# Patient Record
Sex: Male | Born: 1935 | Race: White | Hispanic: No | Marital: Married | Smoking: Never smoker
Health system: Southern US, Community
[De-identification: ages and names within clinical notes are randomized; demographics above are authoritative.]

## PROBLEM LIST (undated history)

## (undated) DIAGNOSIS — E7800 Pure hypercholesterolemia, unspecified: Secondary | ICD-10-CM

## (undated) DIAGNOSIS — I10 Essential (primary) hypertension: Secondary | ICD-10-CM

---

## 2014-11-09 ENCOUNTER — Ambulatory Visit: Payer: Self-pay | Admitting: Internal Medicine

## 2017-08-07 ENCOUNTER — Emergency Department (HOSPITAL_COMMUNITY): Payer: Medicare Other

## 2017-08-07 ENCOUNTER — Observation Stay (HOSPITAL_COMMUNITY)
Admission: EM | Admit: 2017-08-07 | Discharge: 2017-08-10 | Disposition: A | Discharge status: Home / Self Care | Payer: Medicare Other | Attending: Surgery | Admitting: Surgery

## 2017-08-07 ENCOUNTER — Encounter (HOSPITAL_COMMUNITY): Payer: Self-pay

## 2017-08-07 DIAGNOSIS — K819 Cholecystitis, unspecified: Secondary | ICD-10-CM | POA: Diagnosis not present

## 2017-08-07 DIAGNOSIS — E785 Hyperlipidemia, unspecified: Secondary | ICD-10-CM | POA: Diagnosis not present

## 2017-08-07 DIAGNOSIS — Z79899 Other long term (current) drug therapy: Secondary | ICD-10-CM | POA: Diagnosis not present

## 2017-08-07 DIAGNOSIS — K8012 Calculus of gallbladder with acute and chronic cholecystitis without obstruction: Secondary | ICD-10-CM | POA: Insufficient documentation

## 2017-08-07 DIAGNOSIS — I10 Essential (primary) hypertension: Secondary | ICD-10-CM | POA: Insufficient documentation

## 2017-08-07 DIAGNOSIS — E7800 Pure hypercholesterolemia, unspecified: Secondary | ICD-10-CM | POA: Insufficient documentation

## 2017-08-07 HISTORY — DX: Essential (primary) hypertension: I10

## 2017-08-07 HISTORY — DX: Pure hypercholesterolemia, unspecified: E78.00

## 2017-08-07 LAB — I-STAT TROPONIN, ED: TROPONIN I, POC: 0.01 ng/mL (ref 0.00–0.08)

## 2017-08-07 LAB — URINALYSIS, ROUTINE W REFLEX MICROSCOPIC
BILIRUBIN URINE: NEGATIVE
Glucose, UA: NEGATIVE mg/dL
KETONES UR: NEGATIVE mg/dL
LEUKOCYTES UA: NEGATIVE
Nitrite: NEGATIVE
Protein, ur: 100 mg/dL — AB
SPECIFIC GRAVITY, URINE: 1.014 (ref 1.005–1.030)
SQUAMOUS EPITHELIAL / LPF: NONE SEEN
pH: 6 (ref 5.0–8.0)

## 2017-08-07 LAB — CBC
HEMATOCRIT: 40.6 % (ref 39.0–52.0)
Hemoglobin: 14.3 g/dL (ref 13.0–17.0)
MCH: 31 pg (ref 26.0–34.0)
MCHC: 35.2 g/dL (ref 30.0–36.0)
MCV: 87.9 fL (ref 78.0–100.0)
Platelets: 184 10*3/uL (ref 150–400)
RBC: 4.62 MIL/uL (ref 4.22–5.81)
RDW: 13.6 % (ref 11.5–15.5)
WBC: 11.7 10*3/uL — AB (ref 4.0–10.5)

## 2017-08-07 LAB — COMPREHENSIVE METABOLIC PANEL
ALK PHOS: 54 U/L (ref 38–126)
ALT: 24 U/L (ref 17–63)
ANION GAP: 9 (ref 5–15)
AST: 42 U/L — ABNORMAL HIGH (ref 15–41)
Albumin: 4.1 g/dL (ref 3.5–5.0)
BUN: 20 mg/dL (ref 6–20)
CALCIUM: 10.4 mg/dL — AB (ref 8.9–10.3)
CO2: 27 mmol/L (ref 22–32)
CREATININE: 1.23 mg/dL (ref 0.61–1.24)
Chloride: 99 mmol/L — ABNORMAL LOW (ref 101–111)
GFR calc Af Amer: 59 mL/min — ABNORMAL LOW (ref >60)
GFR, EST NON AFRICAN AMERICAN: 51 mL/min — AB (ref >60)
Glucose, Bld: 139 mg/dL — ABNORMAL HIGH (ref 65–99)
Potassium: 3.6 mmol/L (ref 3.5–5.1)
Sodium: 135 mmol/L (ref 135–145)
Total Bilirubin: 1 mg/dL (ref 0.3–1.2)
Total Protein: 7.1 g/dL (ref 6.5–8.1)

## 2017-08-07 LAB — LIPASE, BLOOD: LIPASE: 25 U/L (ref 11–51)

## 2017-08-07 MED ORDER — IOPAMIDOL (ISOVUE-300) INJECTION 61%
INTRAVENOUS | Status: AC
  Administered 2017-08-07: 100 mL
  Filled 2017-08-07: qty 100

## 2017-08-07 MED ORDER — ONDANSETRON HCL 4 MG/2ML IJ SOLN: 4.0000 mg | Freq: Four times a day (QID) | INTRAMUSCULAR | Status: DC | PRN

## 2017-08-07 MED ORDER — ENOXAPARIN SODIUM 30 MG/0.3ML ~~LOC~~ SOLN
30.0000 mg | Freq: Every day | SUBCUTANEOUS | Status: DC
  Administered 2017-08-09 – 2017-08-10 (×2): 30 mg via SUBCUTANEOUS
  Filled 2017-08-07 (×2): qty 0.3

## 2017-08-07 MED ORDER — ONDANSETRON 4 MG PO TBDP
4.0000 mg | ORAL_TABLET | Freq: Four times a day (QID) | ORAL | Status: DC | PRN
Start: 2017-08-07 — End: 2017-08-10
  Filled 2017-08-07: qty 1

## 2017-08-07 MED ORDER — METOPROLOL TARTRATE 5 MG/5ML IV SOLN: 5.0000 mg | Freq: Four times a day (QID) | INTRAVENOUS | Status: DC | PRN

## 2017-08-07 MED ORDER — ACETAMINOPHEN 325 MG PO TABS
650.0000 mg | ORAL_TABLET | Freq: Four times a day (QID) | ORAL | Status: DC | PRN
  Administered 2017-08-08 – 2017-08-10 (×4): 650 mg via ORAL
  Filled 2017-08-07 (×4): qty 2

## 2017-08-07 MED ORDER — DOCUSATE SODIUM 100 MG PO CAPS
100.0000 mg | ORAL_CAPSULE | Freq: Two times a day (BID) | ORAL | Status: DC
  Administered 2017-08-08 – 2017-08-10 (×4): 100 mg via ORAL
  Filled 2017-08-07 (×4): qty 1

## 2017-08-07 MED ORDER — ACETAMINOPHEN 650 MG RE SUPP: 650.0000 mg | Freq: Four times a day (QID) | RECTAL | Status: DC | PRN

## 2017-08-07 MED ORDER — DIPHENHYDRAMINE HCL 50 MG/ML IJ SOLN: 12.5000 mg | Freq: Four times a day (QID) | INTRAMUSCULAR | Status: DC | PRN

## 2017-08-07 MED ORDER — OXYCODONE HCL 5 MG PO TABS
5.0000 mg | ORAL_TABLET | ORAL | Status: DC | PRN
  Administered 2017-08-09: 10 mg via ORAL
  Administered 2017-08-09: 5 mg via ORAL
  Administered 2017-08-10 (×2): 10 mg via ORAL
  Filled 2017-08-07 (×2): qty 2
  Filled 2017-08-07: qty 1
  Filled 2017-08-07: qty 2

## 2017-08-07 MED ORDER — SODIUM CHLORIDE 0.9 % IV SOLN
INTRAVENOUS | Status: DC
  Administered 2017-08-07: via INTRAVENOUS

## 2017-08-07 MED ORDER — DIPHENHYDRAMINE HCL 12.5 MG/5ML PO ELIX: 12.5000 mg | ORAL_SOLUTION | Freq: Four times a day (QID) | ORAL | Status: DC | PRN

## 2017-08-07 MED ORDER — DEXTROSE 5 % IV SOLN
2.0000 g | Freq: Every day | INTRAVENOUS | Status: DC
  Administered 2017-08-07: 2 g via INTRAVENOUS
  Filled 2017-08-07 (×2): qty 2

## 2017-08-07 MED ORDER — AMLODIPINE BESYLATE 5 MG PO TABS
5.0000 mg | ORAL_TABLET | Freq: Every day | ORAL | Status: DC
  Administered 2017-08-09 – 2017-08-10 (×2): 5 mg via ORAL
  Filled 2017-08-07 (×2): qty 1

## 2017-08-07 MED ORDER — HYDRALAZINE HCL 20 MG/ML IJ SOLN: 10.0000 mg | INTRAMUSCULAR | Status: DC | PRN

## 2017-08-07 MED ORDER — FENTANYL CITRATE (PF) 100 MCG/2ML IJ SOLN: 25.0000 ug | INTRAMUSCULAR | Status: DC | PRN

## 2017-08-07 NOTE — H&P (Signed)
Surgical H&P  CC: abdominal pain  HPI: 83 yo surprisingly healthy gentleman who presents to the [facility] ER today with right upper quadrant pain which is sharp and stabbing in quality and associated with nausea and emesis- spent much of christmas night vomiting. The pain has been constant since it began last night around 7pm. He went to urgent care today where he was given pain medication and antiemetics for suspected biliary colic. This did afford him some relief. He tried to eat crackers while there and this exacerbated the pain, this time radiating into his chest. Denies prior similar symptoms. Denies diarrhea, fever, or urinary symptoms. The pain has never really resolved. He has never had abdominal surgery.  He is retired, worked at [employer] in [employer]. Never used tobacco, alcohol or drugs.  Allergies  Allergen Reactions  . Claritin [Loratadine] Hives    Past Medical History:  Diagnosis Date  . High cholesterol   . Hypertension     History reviewed. No pertinent surgical history.  No family history on file.  Social History   Socioeconomic History  . Marital status: Married    Spouse name: None  . Number of children: None  . Years of education: None  . Highest education level: None  Social Needs  . Financial resource strain: None  . Food insecurity - worry: None  . Food insecurity - inability: None  . Transportation needs - medical: None  . Transportation needs - non-medical: None  Occupational History  . None  Tobacco Use  . Smoking status: Never Smoker  . Smokeless tobacco: Never Used  Substance and Sexual Activity  . Alcohol use: No    Frequency: Never  . Drug use: No  . Sexual activity: None  Other Topics Concern  . None  Social History Narrative  . None    No current facility-administered medications on file prior to encounter.    Current Outpatient Medications on File Prior to Encounter  Medication Sig Dispense Refill  . amLODipine  (NORVASC) 5 MG tablet Take 5 mg by mouth daily.  3  . atorvastatin (LIPITOR) 10 MG tablet Take 10 mg by mouth at bedtime.  3  . HYDROcodone-acetaminophen (NORCO/VICODIN) 5-325 MG tablet Take 1 tablet by mouth See admin instructions. 1 tablet every four to six hours as needed for pain    . meloxicam (MOBIC) 15 MG tablet Take 15 mg by mouth daily as needed (for lower back or hip pain).   1  . promethazine (PHENERGAN) 25 MG tablet Take 25 mg by mouth every 8 (eight) hours as needed for nausea or vomiting.       Review of Systems: a complete, 10pt review of systems was completed with pertinent positives and negatives as documented in the HPI  Physical Exam: Vitals:   08/07/17 1915 08/07/17 1945  BP: (!) 142/73 (!) 125/93  Pulse: 69 83  Resp: 17 16  Temp:    SpO2: 98% 96%   Gen: A&Ox3, no distress  Head: normocephalic, atraumatic, EOMI, anicteric.  Neck: supple without mass or thyromegaly Chest: unlabored respirations, symmetrical air entry   Cardiovascular: RRR with palpable distal pulses, mild bilateral lower extremity pitting edema Abdomen: soft, nondistended, mildly tender in right subcostal margin. No mass or organomegaly.  Extremities: warm, no deformities  Neuro: grossly intact Psych: appropriate mood and affect  Skin: warm and dry   CBC Latest Ref Rng & Units 08/07/2017  WBC 4.0 - 10.5 K/uL 11.7(H)  Hemoglobin 13.0 - 17.0 g/dL 14.3  Hematocrit 39.0 - 52.0 % 40.6  Platelets 150 - 400 K/uL 184    CMP Latest Ref Rng & Units 08/07/2017  Glucose 65 - 99 mg/dL 139(H)  BUN 6 - 20 mg/dL 20  Creatinine 0.61 - 1.24 mg/dL 1.23  Sodium 135 - 145 mmol/L 135  Potassium 3.5 - 5.1 mmol/L 3.6  Chloride 101 - 111 mmol/L 99(L)  CO2 22 - 32 mmol/L 27  Calcium 8.9 - 10.3 mg/dL 10.4(H)  Total Protein 6.5 - 8.1 g/dL 7.1  Total Bilirubin 0.3 - 1.2 mg/dL 1.0  Alkaline Phos 38 - 126 U/L 54  AST 15 - 41 U/L 42(H)  ALT 17 - 63 U/L 24    No results found for: INR, PROTIME  Imaging: Dg  Chest 2 View  Result Date: 08/07/2017 CLINICAL DATA:  Abdominal pain. EXAM: CHEST  2 VIEW COMPARISON:  No recent prior. FINDINGS: Mediastinum and hilar structures are normal. Left base pleuroparenchymal thickening noted consistent with scarring. Mild right base pleuroparenchymal thickening consistent with scarring. Lungs are clear of acute infiltrates. Heart size normal. No acute bony abnormality. IMPRESSION: Bibasilar pleuroparenchymal thickening consistent with scarring. Electronically Signed   By: Marcello Moores  Register   On: 08/07/2017 16:40   Ct Abdomen Pelvis W Contrast  Result Date: 08/07/2017 CLINICAL DATA:  83 year old male with right upper quadrant abdominal pain, nausea and diarrhea. EXAM: CT ABDOMEN AND PELVIS WITH CONTRAST TECHNIQUE: Multidetector CT imaging of the abdomen and pelvis was performed using the standard protocol following bolus administration of intravenous contrast. CONTRAST:  158mL ISOVUE-300 IOPAMIDOL (ISOVUE-300) INJECTION 61% COMPARISON:  None. FINDINGS: Lower chest: Minimal bibasilar atelectatic changes. The visualized lung bases are otherwise clear. No intra-abdominal free air.  Small upper abdominal free fluid. Hepatobiliary: Multiple scattered hypodense lesions within the liver measuring up to 1.3 cm in the right lobe of the liver. The larger lesions demonstrate fluid attenuation most consistent with cysts and the smaller lesions are too small to characterize. There is no intrahepatic biliary ductal dilatation. The gallbladder is distended. There multiple stones within the gallbladder. There is inflammatory changes and fluid surrounding the gallbladder most consistent with acute cholecystitis. Pancreas: Small amount of fluid and inflammatory changes surrounding the head of the pancreas likely extension from the inflammatory changes of the gallbladder. Correlation with clinical exam and pancreatic enzymes recommended to exclude associated pancreatitis. Spleen: Normal in size  without focal abnormality. Adrenals/Urinary Tract: Mild renal parenchyma atrophy. Bilateral renal hypodense lesions measuring up to 3.6 cm in the upper pole of the left kidney most consistent with cysts. There is no hydronephrosis on either side. The visualized ureters appear unremarkable. There is apparent diffuse thickening of the bladder wall which may be partly related to underdistention. Cystitis is not excluded. Correlation with urinalysis recommended. Stomach/Bowel: There is sigmoid diverticulosis and scattered colonic diverticula without active inflammatory changes. There is no bowel obstruction or active inflammation. Normal appendix. Vascular/Lymphatic: There is advanced aortoiliac atherosclerotic disease. The origins of the celiac axis, SMA, IMA appear patent. The SMV, splenic vein, and main portal vein are patent. No portal venous gas. There is no adenopathy. Reproductive: Mildly enlarged prostate gland measuring 4.8 cm in diameter. Other: None Musculoskeletal: Multilevel degenerative changes of the spine with disc desiccation and vacuum phenomena. No acute osseous pathology. IMPRESSION: 1. Cholelithiasis with findings most consistent with acute cholecystitis. Clinical correlation is recommended. 2. Mild inflammatory fluid adjacent to the head of the pancreas, likely extension from the gallbladder. Correlation with clinical exam and pancreatic enzymes recommended to exclude associated  pancreatitis. 3. Extensive colonic diverticulosis. No bowel obstruction or active inflammation. Normal appendix. 4.  Aortic Atherosclerosis (ICD10-I70.0). Electronically Signed   By: Anner Crete M.D.   On: 08/07/2017 20:05    A/P: 83yo man with clinical picture and CT concerning for cholecystitis -Admit for pain and nausea control, fluid resuscitation, empiric IV antibiotics -Repeat labs in AM -Plan laparoscopic cholecystectomy tomorrow or Friday with Dr. Dema Severin pending OR availability  I discussed laparoscopic  cholecystectomy with the patient and his son. Discussed risks of pain, scarring, bleeding, intraabdominal injury specifically to the common bile duct and sequelae, bile leak, conversion to open surgery, hernia, ileus, heart attack, pneumonia, blood clot, stroke or even death. Discussed risks of no surgery including worsening cholecystitis, cholangitis, or pancreatitis. Questions welcomed and answered.   Romana Juniper, MD [facility], [place] Pager [phone]

## 2017-08-07 NOTE — ED Triage Notes (Signed)
Per Pt, PT is coming from home where he reports having upper right abdominal pain that started yesterday. Pt was seen at [facility] in [place] and was sent home with medication and possible gallbladder attack. Pt woke up about an hour ago with pain returning. Reports some nausea and vomiting through the night.

## 2017-08-07 NOTE — ED Notes (Signed)
Family remains at bedside.
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
## 2017-08-07 NOTE — ED Notes (Signed)
Pt returned from CT °

## 2017-08-07 NOTE — ED Notes (Signed)
Patient transported to CT 

## 2017-08-07 NOTE — ED Notes (Signed)
ED PA at bedside

## 2017-08-07 NOTE — ED Provider Notes (Signed)
[facility] EMERGENCY DEPARTMENT Provider Note   CSN: [id number] Arrival date & time: 08/07/17  1540     History   Chief Complaint Chief Complaint  Patient presents with  . Abdominal Pain  . Chest Pain    HPI Xavier Foster is a 83 y.o. male.  HPI   83 year old male presents today with complaints of right upper quadrant abdominal pain.  Patient reports he was in his usual state of health last night when he developed sharp stabbing right upper quadrant abdominal pain.  Patient notes associated nausea and small amount of vomiting.  He is persistent since onset of symptoms, was seen by urgent care today and diagnosed with biliary colic.  Patient notes he was given pain medication which slightly improved symptoms, pain continued to persist.  After eating crackers at urgent care he developed epigastric pain radiating into his chest.  He notes this lasted approximately 15 minutes..  Patient denies any associated shortness of breath, reports minor right lower abdominal pain.  He denies any dysuria, diarrhea, fever.  Patient denies any history of the same.   Past Medical History:  Diagnosis Date  . High cholesterol   . Hypertension     Patient Active Problem List   Diagnosis Date Noted  . Cholecystitis 08/07/2017    History reviewed. No pertinent surgical history.     Home Medications    Prior to Admission medications   Medication Sig Start Date End Date Taking? Authorizing Provider  amLODipine (NORVASC) 5 MG tablet Take 5 mg by mouth daily. 06/14/17  Yes [provider]  atorvastatin (LIPITOR) 10 MG tablet Take 10 mg by mouth at bedtime. 06/28/17  Yes [provider]  HYDROcodone-acetaminophen (NORCO/VICODIN) 5-325 MG tablet Take 1 tablet by mouth See admin instructions. 1 tablet every four to six hours as needed for pain 08/07/17  Yes [provider]  meloxicam (MOBIC) 15 MG tablet Take 15 mg by mouth daily as needed (for  lower back or hip pain).  06/12/17  Yes [provider]  promethazine (PHENERGAN) 25 MG tablet Take 25 mg by mouth every 8 (eight) hours as needed for nausea or vomiting.  08/07/17  Yes [provider]    Family History No family history on file.  Social History Social History   Tobacco Use  . Smoking status: Never Smoker  . Smokeless tobacco: Never Used  Substance Use Topics  . Alcohol use: No    Frequency: Never  . Drug use: No     Allergies   Claritin [loratadine]   Review of Systems Review of Systems  All other systems reviewed and are negative.   Physical Exam Updated Vital Signs BP (!) 125/93   Pulse 83   Temp 98.5 F (36.9 C) (Oral)   Resp 16   Ht 5\' 7"  (1.702 m)   Wt 67.1 kg (148 lb)   SpO2 96%   BMI 23.18 kg/m   Physical Exam  Constitutional: He is oriented to person, place, and time. He appears well-developed and well-nourished.  HENT:  Head: Normocephalic and atraumatic.  Eyes: Conjunctivae are normal. Pupils are equal, round, and reactive to light. Right eye exhibits no discharge. Left eye exhibits no discharge. No scleral icterus.  Neck: Normal range of motion. No JVD present. No tracheal deviation present.  Pulmonary/Chest: Effort normal. No stridor.  Abdominal:  Tenderness palpation her right upper and right lower abdomen-no rebound or guarding, no masses  Neurological: He is alert and oriented to  person, place, and time. Coordination normal.  Psychiatric: He has a normal mood and affect. His behavior is normal. Judgment and thought content normal.  Nursing note and vitals reviewed.    ED Treatments / Results  Labs (all labs ordered are listed, but only abnormal results are displayed) Labs Reviewed  COMPREHENSIVE METABOLIC PANEL - Abnormal; Notable for the following components:      Result Value   Chloride 99 (*)    Glucose, Bld 139 (*)    Calcium 10.4 (*)    AST 42 (*)    GFR calc non Af Amer 51 (*)    GFR calc Af  Amer 59 (*)    All other components within normal limits  CBC - Abnormal; Notable for the following components:   WBC 11.7 (*)    All other components within normal limits  URINALYSIS, ROUTINE W REFLEX MICROSCOPIC - Abnormal; Notable for the following components:   Hgb urine dipstick SMALL (*)    Protein, ur 100 (*)    Bacteria, UA RARE (*)    All other components within normal limits  LIPASE, BLOOD  COMPREHENSIVE METABOLIC PANEL  CBC  I-STAT TROPONIN, ED    EKG  EKG Interpretation None       Radiology Dg Chest 2 View  Result Date: 08/07/2017 CLINICAL DATA:  Abdominal pain. EXAM: CHEST  2 VIEW COMPARISON:  No recent prior. FINDINGS: Mediastinum and hilar structures are normal. Left base pleuroparenchymal thickening noted consistent with scarring. Mild right base pleuroparenchymal thickening consistent with scarring. Lungs are clear of acute infiltrates. Heart size normal. No acute bony abnormality. IMPRESSION: Bibasilar pleuroparenchymal thickening consistent with scarring. Electronically Signed   By: Marcello Moores  Register   On: 08/07/2017 16:40   Ct Abdomen Pelvis W Contrast  Result Date: 08/07/2017 CLINICAL DATA:  83 year old male with right upper quadrant abdominal pain, nausea and diarrhea. EXAM: CT ABDOMEN AND PELVIS WITH CONTRAST TECHNIQUE: Multidetector CT imaging of the abdomen and pelvis was performed using the standard protocol following bolus administration of intravenous contrast. CONTRAST:  156mL ISOVUE-300 IOPAMIDOL (ISOVUE-300) INJECTION 61% COMPARISON:  None. FINDINGS: Lower chest: Minimal bibasilar atelectatic changes. The visualized lung bases are otherwise clear. No intra-abdominal free air.  Small upper abdominal free fluid. Hepatobiliary: Multiple scattered hypodense lesions within the liver measuring up to 1.3 cm in the right lobe of the liver. The larger lesions demonstrate fluid attenuation most consistent with cysts and the smaller lesions are too small to  characterize. There is no intrahepatic biliary ductal dilatation. The gallbladder is distended. There multiple stones within the gallbladder. There is inflammatory changes and fluid surrounding the gallbladder most consistent with acute cholecystitis. Pancreas: Small amount of fluid and inflammatory changes surrounding the head of the pancreas likely extension from the inflammatory changes of the gallbladder. Correlation with clinical exam and pancreatic enzymes recommended to exclude associated pancreatitis. Spleen: Normal in size without focal abnormality. Adrenals/Urinary Tract: Mild renal parenchyma atrophy. Bilateral renal hypodense lesions measuring up to 3.6 cm in the upper pole of the left kidney most consistent with cysts. There is no hydronephrosis on either side. The visualized ureters appear unremarkable. There is apparent diffuse thickening of the bladder wall which may be partly related to underdistention. Cystitis is not excluded. Correlation with urinalysis recommended. Stomach/Bowel: There is sigmoid diverticulosis and scattered colonic diverticula without active inflammatory changes. There is no bowel obstruction or active inflammation. Normal appendix. Vascular/Lymphatic: There is advanced aortoiliac atherosclerotic disease. The origins of the celiac axis, SMA, IMA appear patent.  The SMV, splenic vein, and main portal vein are patent. No portal venous gas. There is no adenopathy. Reproductive: Mildly enlarged prostate gland measuring 4.8 cm in diameter. Other: None Musculoskeletal: Multilevel degenerative changes of the spine with disc desiccation and vacuum phenomena. No acute osseous pathology. IMPRESSION: 1. Cholelithiasis with findings most consistent with acute cholecystitis. Clinical correlation is recommended. 2. Mild inflammatory fluid adjacent to the head of the pancreas, likely extension from the gallbladder. Correlation with clinical exam and pancreatic enzymes recommended to exclude  associated pancreatitis. 3. Extensive colonic diverticulosis. No bowel obstruction or active inflammation. Normal appendix. 4.  Aortic Atherosclerosis (ICD10-I70.0). Electronically Signed   By: Anner Crete M.D.   On: 08/07/2017 20:05    Procedures Procedures (including critical care time)  Medications Ordered in ED Medications  enoxaparin (LOVENOX) injection 30 mg (not administered)  0.9 %  sodium chloride infusion (not administered)  cefTRIAXone (ROCEPHIN) 2 g in dextrose 5 % 50 mL IVPB (not administered)  acetaminophen (TYLENOL) tablet 650 mg (not administered)    Or  acetaminophen (TYLENOL) suppository 650 mg (not administered)  oxyCODONE (Oxy IR/ROXICODONE) immediate release tablet 5-10 mg (not administered)  fentaNYL (SUBLIMAZE) injection 25 mcg (not administered)  diphenhydrAMINE (BENADRYL) 12.5 MG/5ML elixir 12.5 mg (not administered)    Or  diphenhydrAMINE (BENADRYL) injection 12.5 mg (not administered)  docusate sodium (COLACE) capsule 100 mg (not administered)  ondansetron (ZOFRAN-ODT) disintegrating tablet 4 mg (not administered)    Or  ondansetron (ZOFRAN) injection 4 mg (not administered)  metoprolol tartrate (LOPRESSOR) injection 5 mg (not administered)  hydrALAZINE (APRESOLINE) injection 10 mg (not administered)  amLODipine (NORVASC) tablet 5 mg (not administered)  iopamidol (ISOVUE-300) 61 % injection (100 mLs  Contrast Given 08/07/17 1935)     Initial Impression / Assessment and Plan / ED Course  I have reviewed the triage vital signs and the nursing notes.  Pertinent labs & imaging results that were available during my care of the patient were reviewed by me and considered in my medical decision making (see chart for details).      Final Clinical Impressions(s) / ED Diagnoses   Final diagnoses:  Cholecystitis    Labs: I-STAT troponin, lipase, CMP, CBC, urinalysis  Imaging:  Consults: General surgery   Therapeutics:  Discharge Meds:    Assessment/Plan:  83 year old male presents today with likely cholecystitis.  Patient is well-appearing in no apparent distress.  General surgery consulted to evaluate patient here in the ED.    ED Discharge Orders    None       Francee Gentile 08/07/17 2059    Jola Schmidt, MD 08/07/17 2140

## 2017-08-08 ENCOUNTER — Encounter (HOSPITAL_COMMUNITY)
Admission: EM | Disposition: A | Discharge status: Home / Self Care | Payer: Self-pay | Source: Home / Self Care | Attending: Emergency Medicine

## 2017-08-08 ENCOUNTER — Observation Stay (HOSPITAL_COMMUNITY): Payer: Medicare Other | Admitting: Certified Registered"

## 2017-08-08 ENCOUNTER — Encounter (HOSPITAL_COMMUNITY): Payer: Self-pay | Admitting: *Deleted

## 2017-08-08 DIAGNOSIS — E785 Hyperlipidemia, unspecified: Secondary | ICD-10-CM | POA: Diagnosis not present

## 2017-08-08 DIAGNOSIS — I10 Essential (primary) hypertension: Secondary | ICD-10-CM | POA: Diagnosis not present

## 2017-08-08 DIAGNOSIS — K8012 Calculus of gallbladder with acute and chronic cholecystitis without obstruction: Secondary | ICD-10-CM | POA: Diagnosis not present

## 2017-08-08 DIAGNOSIS — K819 Cholecystitis, unspecified: Secondary | ICD-10-CM | POA: Diagnosis not present

## 2017-08-08 HISTORY — PX: CHOLECYSTECTOMY: SHX55

## 2017-08-08 LAB — COMPREHENSIVE METABOLIC PANEL
ALBUMIN: 3.5 g/dL (ref 3.5–5.0)
ALK PHOS: 46 U/L (ref 38–126)
ALT: 21 U/L (ref 17–63)
AST: 31 U/L (ref 15–41)
Anion gap: 13 (ref 5–15)
BILIRUBIN TOTAL: 0.9 mg/dL (ref 0.3–1.2)
BUN: 20 mg/dL (ref 6–20)
CALCIUM: 9.2 mg/dL (ref 8.9–10.3)
CO2: 23 mmol/L (ref 22–32)
Chloride: 99 mmol/L — ABNORMAL LOW (ref 101–111)
Creatinine, Ser: 1.27 mg/dL — ABNORMAL HIGH (ref 0.61–1.24)
GFR calc Af Amer: 57 mL/min — ABNORMAL LOW (ref >60)
GFR calc non Af Amer: 49 mL/min — ABNORMAL LOW (ref >60)
GLUCOSE: 105 mg/dL — AB (ref 65–99)
Potassium: 3.2 mmol/L — ABNORMAL LOW (ref 3.5–5.1)
Sodium: 135 mmol/L (ref 135–145)
TOTAL PROTEIN: 6.3 g/dL — AB (ref 6.5–8.1)

## 2017-08-08 LAB — CBC
HCT: 39.6 % (ref 39.0–52.0)
HEMOGLOBIN: 13.6 g/dL (ref 13.0–17.0)
MCH: 30.4 pg (ref 26.0–34.0)
MCHC: 34.3 g/dL (ref 30.0–36.0)
MCV: 88.4 fL (ref 78.0–100.0)
PLATELETS: 184 10*3/uL (ref 150–400)
RBC: 4.48 MIL/uL (ref 4.22–5.81)
RDW: 14 % (ref 11.5–15.5)
WBC: 9 10*3/uL (ref 4.0–10.5)

## 2017-08-08 LAB — SURGICAL PCR SCREEN
MRSA, PCR: POSITIVE — AB
Staphylococcus aureus: POSITIVE — AB

## 2017-08-08 SURGERY — LAPAROSCOPIC CHOLECYSTECTOMY
Anesthesia: General | Site: Abdomen

## 2017-08-08 MED ORDER — HYDROMORPHONE HCL 1 MG/ML IJ SOLN: 0.5000 mg | INTRAMUSCULAR | Status: DC | PRN

## 2017-08-08 MED ORDER — PROPOFOL 10 MG/ML IV BOLUS
INTRAVENOUS | Status: DC | PRN
  Administered 2017-08-08: 90 mg via INTRAVENOUS

## 2017-08-08 MED ORDER — LIDOCAINE 2% (20 MG/ML) 5 ML SYRINGE
INTRAMUSCULAR | Status: DC | PRN
  Administered 2017-08-08: 100 mg via INTRAVENOUS

## 2017-08-08 MED ORDER — NEOSTIGMINE METHYLSULFATE 5 MG/5ML IV SOSY
PREFILLED_SYRINGE | INTRAVENOUS | Status: AC
  Filled 2017-08-08: qty 5

## 2017-08-08 MED ORDER — PHENYLEPHRINE 40 MCG/ML (10ML) SYRINGE FOR IV PUSH (FOR BLOOD PRESSURE SUPPORT)
PREFILLED_SYRINGE | INTRAVENOUS | Status: AC
  Filled 2017-08-08: qty 10

## 2017-08-08 MED ORDER — ONDANSETRON HCL 4 MG/2ML IJ SOLN
INTRAMUSCULAR | Status: AC
  Filled 2017-08-08: qty 2

## 2017-08-08 MED ORDER — SUGAMMADEX SODIUM 500 MG/5ML IV SOLN
INTRAVENOUS | Status: AC
  Filled 2017-08-08: qty 5

## 2017-08-08 MED ORDER — MEPERIDINE HCL 25 MG/ML IJ SOLN: 6.2500 mg | INTRAMUSCULAR | Status: DC | PRN

## 2017-08-08 MED ORDER — ONDANSETRON HCL 4 MG/2ML IJ SOLN
INTRAMUSCULAR | Status: DC | PRN
Start: 2017-08-08 — End: 2017-08-08
  Administered 2017-08-08: 4 mg via INTRAVENOUS

## 2017-08-08 MED ORDER — BUPIVACAINE-EPINEPHRINE 0.25% -1:200000 IJ SOLN
INTRAMUSCULAR | Status: DC | PRN
  Administered 2017-08-08: 26 mL

## 2017-08-08 MED ORDER — MUPIROCIN 2 % EX OINT
1.0000 "application " | TOPICAL_OINTMENT | Freq: Two times a day (BID) | CUTANEOUS | Status: DC
  Administered 2017-08-08 – 2017-08-09 (×4): 1 via NASAL
  Filled 2017-08-08 (×2): qty 22

## 2017-08-08 MED ORDER — CHLORHEXIDINE GLUCONATE CLOTH 2 % EX PADS
6.0000 | MEDICATED_PAD | Freq: Every day | CUTANEOUS | Status: DC
  Administered 2017-08-08 – 2017-08-10 (×3): 6 via TOPICAL

## 2017-08-08 MED ORDER — BUPIVACAINE-EPINEPHRINE (PF) 0.25% -1:200000 IJ SOLN
INTRAMUSCULAR | Status: AC
  Filled 2017-08-08: qty 30

## 2017-08-08 MED ORDER — HYDROMORPHONE HCL 1 MG/ML IJ SOLN
INTRAMUSCULAR | Status: AC
  Filled 2017-08-08: qty 1

## 2017-08-08 MED ORDER — TRAMADOL HCL 50 MG PO TABS
50.0000 mg | ORAL_TABLET | Freq: Four times a day (QID) | ORAL | Status: DC | PRN
Start: 2017-08-08 — End: 2017-08-10

## 2017-08-08 MED ORDER — FENTANYL CITRATE (PF) 250 MCG/5ML IJ SOLN
INTRAMUSCULAR | Status: AC
  Filled 2017-08-08: qty 5

## 2017-08-08 MED ORDER — LIDOCAINE 2% (20 MG/ML) 5 ML SYRINGE
INTRAMUSCULAR | Status: AC
  Filled 2017-08-08: qty 5

## 2017-08-08 MED ORDER — FENTANYL CITRATE (PF) 100 MCG/2ML IJ SOLN
INTRAMUSCULAR | Status: DC | PRN
Start: 2017-08-08 — End: 2017-08-08
  Administered 2017-08-08: 100 ug via INTRAVENOUS
  Administered 2017-08-08 (×3): 50 ug via INTRAVENOUS

## 2017-08-08 MED ORDER — HEMOSTATIC AGENTS (NO CHARGE) OPTIME
TOPICAL | Status: DC | PRN
  Administered 2017-08-08: 1 via TOPICAL

## 2017-08-08 MED ORDER — 0.9 % SODIUM CHLORIDE (POUR BTL) OPTIME
TOPICAL | Status: DC | PRN
  Administered 2017-08-08: 1000 mL

## 2017-08-08 MED ORDER — PHENYLEPHRINE HCL 10 MG/ML IJ SOLN
INTRAMUSCULAR | Status: AC
  Filled 2017-08-08: qty 1

## 2017-08-08 MED ORDER — ROCURONIUM BROMIDE 10 MG/ML (PF) SYRINGE
PREFILLED_SYRINGE | INTRAVENOUS | Status: AC
  Filled 2017-08-08: qty 5

## 2017-08-08 MED ORDER — LACTATED RINGERS IV SOLN
INTRAVENOUS | Status: DC
  Administered 2017-08-08 (×2): via INTRAVENOUS

## 2017-08-08 MED ORDER — ROCURONIUM BROMIDE 10 MG/ML (PF) SYRINGE
PREFILLED_SYRINGE | INTRAVENOUS | Status: DC | PRN
  Administered 2017-08-08: 50 mg via INTRAVENOUS
  Administered 2017-08-08: 10 mg via INTRAVENOUS

## 2017-08-08 MED ORDER — ONDANSETRON HCL 4 MG/2ML IJ SOLN: 4.0000 mg | Freq: Once | INTRAMUSCULAR | Status: DC | PRN

## 2017-08-08 MED ORDER — PHENYLEPHRINE HCL 10 MG/ML IJ SOLN
INTRAMUSCULAR | Status: DC | PRN
  Administered 2017-08-08: 25 ug/min via INTRAVENOUS

## 2017-08-08 MED ORDER — SCOPOLAMINE 1 MG/3DAYS TD PT72
MEDICATED_PATCH | TRANSDERMAL | Status: DC | PRN
  Administered 2017-08-08: 1 via TRANSDERMAL

## 2017-08-08 MED ORDER — PROPOFOL 10 MG/ML IV BOLUS
INTRAVENOUS | Status: AC
  Filled 2017-08-08: qty 20

## 2017-08-08 MED ORDER — HYDROMORPHONE HCL 1 MG/ML IJ SOLN
0.2500 mg | INTRAMUSCULAR | Status: DC | PRN
  Administered 2017-08-08 (×2): 0.5 mg via INTRAVENOUS

## 2017-08-08 MED ORDER — SODIUM CHLORIDE 0.9 % IR SOLN
Status: DC | PRN
  Administered 2017-08-08: 1

## 2017-08-08 MED ORDER — SUGAMMADEX SODIUM 200 MG/2ML IV SOLN
INTRAVENOUS | Status: DC | PRN
Start: 2017-08-08 — End: 2017-08-08
  Administered 2017-08-08: 240 mg via INTRAVENOUS

## 2017-08-08 MED ORDER — PHENYLEPHRINE 40 MCG/ML (10ML) SYRINGE FOR IV PUSH (FOR BLOOD PRESSURE SUPPORT)
PREFILLED_SYRINGE | INTRAVENOUS | Status: DC | PRN
  Administered 2017-08-08 (×2): 120 ug via INTRAVENOUS

## 2017-08-08 SURGICAL SUPPLY — 45 items
APPLIER CLIP 5 13 M/L LIGAMAX5 (MISCELLANEOUS) ×3
BLADE CLIPPER SURG (BLADE) IMPLANT
CANISTER SUCT 3000ML PPV (MISCELLANEOUS) ×3 IMPLANT
CHLORAPREP W/TINT 26ML (MISCELLANEOUS) ×3 IMPLANT
CLIP APPLIE 5 13 M/L LIGAMAX5 (MISCELLANEOUS) ×1 IMPLANT
COVER SURGICAL LIGHT HANDLE (MISCELLANEOUS) ×3 IMPLANT
CUTTER FLEX LINEAR 45M (STAPLE) ×3 IMPLANT
DERMABOND ADHESIVE PROPEN (GAUZE/BANDAGES/DRESSINGS) ×2
DERMABOND ADVANCED (GAUZE/BANDAGES/DRESSINGS) ×2
DERMABOND ADVANCED .7 DNX12 (GAUZE/BANDAGES/DRESSINGS) ×1 IMPLANT
DERMABOND ADVANCED .7 DNX6 (GAUZE/BANDAGES/DRESSINGS) ×1 IMPLANT
DISSECTOR BLUNT TIP ENDO 5MM (MISCELLANEOUS) ×3 IMPLANT
ELECT REM PT RETURN 9FT ADLT (ELECTROSURGICAL) ×3
ELECTRODE REM PT RTRN 9FT ADLT (ELECTROSURGICAL) ×1 IMPLANT
GLOVE BIOGEL M STRL SZ7.5 (GLOVE) ×3 IMPLANT
GLOVE BIOGEL PI IND STRL 7.0 (GLOVE) ×1 IMPLANT
GLOVE BIOGEL PI IND STRL 8 (GLOVE) ×1 IMPLANT
GLOVE BIOGEL PI INDICATOR 7.0 (GLOVE) ×2
GLOVE BIOGEL PI INDICATOR 8 (GLOVE) ×2
GLOVE SURG SS PI 7.0 STRL IVOR (GLOVE) ×3 IMPLANT
GOWN STRL REUS W/ TWL LRG LVL3 (GOWN DISPOSABLE) ×2 IMPLANT
GOWN STRL REUS W/ TWL XL LVL3 (GOWN DISPOSABLE) ×1 IMPLANT
GOWN STRL REUS W/TWL LRG LVL3 (GOWN DISPOSABLE) ×4
GOWN STRL REUS W/TWL XL LVL3 (GOWN DISPOSABLE) ×2
HEMOSTAT SNOW SURGICEL 2X4 (HEMOSTASIS) ×3 IMPLANT
KIT BASIN OR (CUSTOM PROCEDURE TRAY) ×3 IMPLANT
KIT ROOM TURNOVER OR (KITS) ×3 IMPLANT
NS IRRIG 1000ML POUR BTL (IV SOLUTION) ×3 IMPLANT
PAD ARMBOARD 7.5X6 YLW CONV (MISCELLANEOUS) ×6 IMPLANT
POUCH SPECIMEN RETRIEVAL 10MM (ENDOMECHANICALS) ×3 IMPLANT
RELOAD STAPLE TA45 3.5 REG BLU (ENDOMECHANICALS) ×3 IMPLANT
SCISSORS LAP 5X35 DISP (ENDOMECHANICALS) ×3 IMPLANT
SET IRRIG TUBING LAPAROSCOPIC (IRRIGATION / IRRIGATOR) ×3 IMPLANT
SLEEVE ENDOPATH XCEL 5M (ENDOMECHANICALS) ×6 IMPLANT
SPECIMEN JAR SMALL (MISCELLANEOUS) ×3 IMPLANT
SUT MNCRL AB 4-0 PS2 18 (SUTURE) ×6 IMPLANT
SUT VICRYL 0 UR6 27IN ABS (SUTURE) ×3 IMPLANT
TOWEL OR 17X24 6PK STRL BLUE (TOWEL DISPOSABLE) ×3 IMPLANT
TOWEL OR 17X26 10 PK STRL BLUE (TOWEL DISPOSABLE) ×3 IMPLANT
TRAY LAPAROSCOPIC MC (CUSTOM PROCEDURE TRAY) ×3 IMPLANT
TROCAR BLADELESS 11MM (ENDOMECHANICALS) IMPLANT
TROCAR BLADELESS 12MM (ENDOMECHANICALS) ×3 IMPLANT
TROCAR XCEL BLUNT TIP 100MML (ENDOMECHANICALS) ×3 IMPLANT
TROCAR XCEL NON-BLD 5MMX100MML (ENDOMECHANICALS) ×3 IMPLANT
TUBING INSUFFLATION (TUBING) ×3 IMPLANT

## 2017-08-08 NOTE — Progress Notes (Signed)
Subjective No acute events. Pain better since IV abx staretd. Denies complaints. No n/v.  Objective: Vital signs in last 24 hours: Temp:  [97.8 F (36.6 C)-98.9 F (37.2 C)] 98.9 F (37.2 C) (12/27 0500) Pulse Rate:  [69-84] 78 (12/27 0500) Resp:  [16-20] 18 (12/27 0500) BP: (125-167)/(67-93) 153/67 (12/27 0500) SpO2:  [96 %-99 %] 97 % (12/27 0500) Weight:  [60.4 kg (133 lb 2.5 oz)-67.1 kg (148 lb)] 60.4 kg (133 lb 2.5 oz) (12/27 0129) Last BM Date: 08/07/17  Intake/Output from previous day: 12/26 0701 - 12/27 0700 In: -  Out: 2 [Urine:1; Stool:1] Intake/Output this shift: No intake/output data recorded.  Gen: NAD, comfortable CV: RRR Pulm: Normal work of breathing Abd: Soft, minimally ttp in RUQ; no r/g; nondistended abdomen Ext: SCDs in place  Lab Results: CBC  Recent Labs    08/07/17 1616  WBC 11.7*  HGB 14.3  HCT 40.6  PLT 184   BMET Recent Labs    08/07/17 1616  NA 135  K 3.6  CL 99*  CO2 27  GLUCOSE 139*  BUN 20  CREATININE 1.23  CALCIUM 10.4*    Anti-infectives: Anti-infectives (From admission, onward)   Start     Dose/Rate Route Frequency Ordered Stop   08/07/17 2300  cefTRIAXone (ROCEPHIN) 2 g in dextrose 5 % 50 mL IVPB     2 g 100 mL/hr over 30 Minutes Intravenous Daily at bedtime 08/07/17 2044         Assessment/Plan: Patient Active Problem List   Diagnosis Date Noted  . Cholecystitis 08/07/2017   83yo gentleman with hx HTN, [name] admitted with acute cholecystitis - 2d of RUQ pain, WBC 11.7, CT showing stones and some surrounding inflammation around gallbladder  -IVF, NPO, IV abx -Possible OR later today for laparoscopic vs open cholecystectomy, but more likely than not will not be able to do until tomorrow due to scheduling issues - ok for clears at 4pm if not going to OR; NPO after midnight -PPx: SQH, SCDs  Sharon Mt. Dema Severin, M.D. [facility]

## 2017-08-08 NOTE — Anesthesia Postprocedure Evaluation (Signed)
Anesthesia Post Note  Patient: Xavier Foster  Procedure(s) Performed: LAPAROSCOPIC CHOLECYSTECTOMY (N/A Abdomen)     Patient location during evaluation: PACU Anesthesia Type: General Level of consciousness: awake and alert Pain management: pain level controlled Vital Signs Assessment: post-procedure vital signs reviewed and stable Respiratory status: spontaneous breathing, nonlabored ventilation, respiratory function stable and patient connected to nasal cannula oxygen Cardiovascular status: blood pressure returned to baseline and stable Postop Assessment: no apparent nausea or vomiting Anesthetic complications: no    Last Vitals:  Vitals:   08/08/17 1752 08/08/17 1827  BP:  (!) 170/80  Pulse: 95 95  Resp: 20 20  Temp: (!) 36.3 C 36.6 C  SpO2: 96% 98%    Last Pain:  Vitals:   08/08/17 1827  TempSrc: Oral  PainSc:                  Ell Tiso DAVID

## 2017-08-08 NOTE — Progress Notes (Signed)
Patient ambulated to the bathroom with one assist and walker and voided.  He tolerated the walk very well.

## 2017-08-08 NOTE — Progress Notes (Signed)
The anatomy & physiology of hepatobiliary & pancreatic function was discussed.  The pathophysiology of gallbladder dysfunction was discussed.  Natural history risks without surgery was discussed.   I feel the risks of no intervention will lead to serious problems that outweigh the operative risks; therefore, I recommended cholecystectomy to address the pathology.  I explained laparoscopic techniques with possible need for an open approach.  Possible cholangiogram to evaluate the bilary tract was explained as well.    The planned procedure, material risks (including but not limited to pain, bleeding, infection, abscess, bile leak, injury to other organs/viscus/vessels/nerves, need for additional procedures, heart attack, stroke, death) benefits and alternatives were discussed.  I noted a good likelihood this will help address the problem.  Possibility that this will not correct all abdominal symptoms was explained.  Goals of post-operative recovery were discussed as well. His questions were answered to his satisfaction and he elected to proceed with surgery  Sharon Mt. Dema Severin, M.D. [facility]

## 2017-08-08 NOTE — Anesthesia Procedure Notes (Signed)
Procedure Name: Intubation Date/Time: 08/08/2017 3:05 PM Performed by: Barrington Ellison, CRNA Pre-anesthesia Checklist: Patient identified, Emergency Drugs available, Suction available and Patient being monitored Patient Re-evaluated:Patient Re-evaluated prior to induction Oxygen Delivery Method: Circle System Utilized Preoxygenation: Pre-oxygenation with 100% oxygen Induction Type: IV induction Ventilation: Mask ventilation without difficulty Laryngoscope Size: Mac and 3 Grade View: Grade I Tube type: Oral Tube size: 7.5 mm Number of attempts: 1 Airway Equipment and Method: Stylet and Oral airway Placement Confirmation: ETT inserted through vocal cords under direct vision,  positive ETCO2 and breath sounds checked- equal and bilateral Secured at: 22 cm Tube secured with: Tape Dental Injury: Teeth and Oropharynx as per pre-operative assessment

## 2017-08-08 NOTE — Progress Notes (Signed)
Report given to short stay at this time.  

## 2017-08-08 NOTE — Op Note (Addendum)
08/08/2017 4:34 PM  PATIENT: Xavier Foster  83 y.o. male  Patient Care Team: Albina Billet, MD as PCP - General (Internal Medicine)  PRE-OPERATIVE DIAGNOSIS: CHOLECYSTITIS  POST-OPERATIVE DIAGNOSIS: CHOLECYSTITIS  PROCEDURE: Laparoscopic cholecystectomy  SURGEON: Sharon Mt. Theda Payer, MD  ASSISTANT: none   ANESTHESIA: General endotracheal  EBL: No intake/output data recorded.  DRAINS: None  SPECIMEN: Gallbladder  COUNTS: Sponge, needle and instrument counts were reported correct x2 at the conclusion of the operation  DISPOSITION: PACU in satisfactory condition  FINDINGS: Inflamed gallbladder; critical view of safety obtained prior to ligation/division of any structures.  INDICATION:  Xavier Foster is a very pleasant 83yo gentleman with hx of HTN, [name] admitted overnight with acute cholecystitis.   The anatomy & physiology of hepatobiliary & pancreatic function was discussed. The pathophysiology of gallbladder dysfunction was discussed. Natural history and material risks without surgery was discussed. I feel the risks of no intervention will lead to serious problems that outweigh the operative risks; therefore, I recommended cholecystectomy to remove the probable pathology. I explained laparoscopic techniques with possible need for an open approach.  The procedure, material risks (including but not limited to pain, bleeding; infection; scarring; abscess; bile leak; injury to other organs such as bowel, blood vessels, nerves, bile duct; hernia; the need for additional procedures; heart attack; stroke; death), benefits and alternatives to surgery were discussed at length. The patient's questions were answered to their satisfaction and the patient elected to proceed with surgery.  I noted a good likelihood this will help address the problem.  Possibility that this will not correct all abdominal symptoms was also explained.  Goals of post-operative recovery were discussed as well.      DESCRIPTION:   The patient was identified & brought into the operating room. The patient was positioned supine on the OR table. SCDs were in place and active during the entire case. The patient underwent general endotracheal anesthesia. The abdomen was prepped and draped in the standard sterile fashion and antibiotics were administered. A surgical timeout was performed and confirmed our plan.   A periumbilical incision was made. The umbilical stalk was grasped and retracted outwardly. The infraumbilical fascia was identified and incised. The peritoneal cavity was gently entered bluntly. A purse-string 0 Vicryl suture was placed. The Hasson cannula was inserted into the peritoneal cavity and insufflation with CO2 commenced to 53mmHg. A laparoscope was inserted into the peritoneal cavity and inspection confirmed no evidence of trocar site complications. The patient was then positioned in reverse Trendelenburg with the left side down. 3 additional 78mm trocars were placed along the right subcostal line - one 54mm port in mid subcostal region, another 30mm port in the right flank near the anterior axillary line, and a third 59mm port in the left subxiphoid region obliquely near the falciform ligament.  The liver and gallbladder were inspected. The gallbladder fundus was grasped and elevated cephalad. An additional grasper was then placed on the infundibulum of the gallbladder and the infundibulum was retracted laterally. Gentle blunt dissection was then employed with a IT consultant working down into [employer]. The peritoneum on both sides of the gallbladder was opened with hook cautery. The cystic duct was identified, carefully circumferentially dissected. The cystic artery was then carefully circumferentially dissected. The space between the cystic artery and hepatocystic plate was developed such that the liver could be seen through a window medial to the cystic artery. The triangle of Calot was  cleared of all fibrofatty tissue. At this point, a  critical view of safety was achieved and the only structures visualized was the skeletonized cystic duct laterally, the skeletonized cystic artery and the liver through the window medial to the artery.   The cystic duct was enlarged and clips were too short to cross the entire duct. The midepigastric 71mm port was exchanged for a 10mm port and the cystic duct was then divided with an endoGIA blue load at the infundibulum-cystic duct junction well away from the common bile duct. The artery was small and atretic, clipped with 2 clips on the "stay" side and divided with cautery on the specimen side. The gallbladder was then freed from its remaining attachments to the liver using electrocautery and placed into an endocatch bag. Hemostasis was achieved and then re-verified. The rest of the abdomen was inspected no injury nor bleeding elsewhere was identified. The RUQ was irrigated with normal saline. The clips and gallbladder fossa were reinspected and noted to be in place and hemostatic.  The gallbladder and endocatch bag was then removed from the umbilical port site and passed off as specimen. The RUQ ports were removed under direct visualization and noted to be hemostatic. The umbilical fascia was then closed using 0 Vicryl suture. The skin of all incision sites was approximated with 4-0 monocryl subcuticular suture and dermabond applied. The patient was then extubated and transferred to a stretcher for transport to PACU in satisfactory condition.   Addendum: A piece of surgicel was placed in the gallbladder fossa just prior to port removal

## 2017-08-08 NOTE — Anesthesia Preprocedure Evaluation (Addendum)
Anesthesia Evaluation  Patient identified by MRN, date of birth, ID band Patient awake    Reviewed: Allergy & Precautions, NPO status , Patient's Chart, lab work & pertinent test results  Airway Mallampati: I  TM Distance: >3 FB Neck ROM: Full    Dental  (+) Edentulous Upper, Partial Lower, Missing, Caps, Dental Advisory Given   Pulmonary    Pulmonary exam normal        Cardiovascular hypertension, Pt. on medications Normal cardiovascular exam     Neuro/Psych    GI/Hepatic   Endo/Other    Renal/GU      Musculoskeletal   Abdominal   Peds  Hematology   Anesthesia Other Findings   Reproductive/Obstetrics                            Anesthesia Physical Anesthesia Plan  ASA: III  Anesthesia Plan: General   Post-op Pain Management:    Induction: Intravenous  PONV Risk Score and Plan: 2 and Ondansetron, Midazolam and Treatment may vary due to age or medical condition  Airway Management Planned: Oral ETT  Additional Equipment:   Intra-op Plan:   Post-operative Plan: Extubation in OR  Informed Consent: I have reviewed the patients History and Physical, chart, labs and discussed the procedure including the risks, benefits and alternatives for the proposed anesthesia with the patient or authorized representative who has indicated his/her understanding and acceptance.     Plan Discussed with: CRNA and Surgeon  Anesthesia Plan Comments:         Anesthesia Quick Evaluation

## 2017-08-08 NOTE — Transfer of Care (Signed)
Immediate Anesthesia Transfer of Care Note  Patient: Xavier Foster  Procedure(s) Performed: LAPAROSCOPIC CHOLECYSTECTOMY (N/A Abdomen)  Patient Location: PACU  Anesthesia Type:General  Level of Consciousness: awake and oriented  Airway & Oxygen Therapy: Patient Spontanous Breathing and Patient connected to nasal cannula oxygen  Post-op Assessment: Report given to RN and Patient moving all extremities X 4  Post vital signs: Reviewed and stable  Last Vitals:  Vitals:   08/08/17 0129 08/08/17 0500  BP: (!) 167/71 (!) 153/67  Pulse: 78 78  Resp: 18 18  Temp: 36.6 C 37.2 C  SpO2: 98% 97%    Last Pain:  Vitals:   08/08/17 0500  TempSrc: Oral  PainSc:          Complications: No apparent anesthesia complications

## 2017-08-09 ENCOUNTER — Encounter (HOSPITAL_COMMUNITY): Payer: Self-pay | Admitting: Surgery

## 2017-08-09 LAB — CBC WITH DIFFERENTIAL/PLATELET
BASOS PCT: 0 %
Basophils Absolute: 0 10*3/uL (ref 0.0–0.1)
Eosinophils Absolute: 0.1 10*3/uL (ref 0.0–0.7)
Eosinophils Relative: 1 %
HEMATOCRIT: 38.7 % — AB (ref 39.0–52.0)
Hemoglobin: 12.9 g/dL — ABNORMAL LOW (ref 13.0–17.0)
LYMPHS PCT: 11 %
Lymphs Abs: 1 10*3/uL (ref 0.7–4.0)
MCH: 30.4 pg (ref 26.0–34.0)
MCHC: 33.3 g/dL (ref 30.0–36.0)
MCV: 91.1 fL (ref 78.0–100.0)
MONO ABS: 1 10*3/uL (ref 0.1–1.0)
MONOS PCT: 11 %
NEUTROS ABS: 6.9 10*3/uL (ref 1.7–7.7)
Neutrophils Relative %: 77 %
Platelets: 147 10*3/uL — ABNORMAL LOW (ref 150–400)
RBC: 4.25 MIL/uL (ref 4.22–5.81)
RDW: 14.4 % (ref 11.5–15.5)
WBC: 9 10*3/uL (ref 4.0–10.5)

## 2017-08-09 LAB — COMPREHENSIVE METABOLIC PANEL
ALT: 50 U/L (ref 17–63)
ANION GAP: 9 (ref 5–15)
AST: 78 U/L — ABNORMAL HIGH (ref 15–41)
Albumin: 3.3 g/dL — ABNORMAL LOW (ref 3.5–5.0)
Alkaline Phosphatase: 41 U/L (ref 38–126)
BILIRUBIN TOTAL: 1.2 mg/dL (ref 0.3–1.2)
BUN: 17 mg/dL (ref 6–20)
CO2: 27 mmol/L (ref 22–32)
Calcium: 8.7 mg/dL — ABNORMAL LOW (ref 8.9–10.3)
Chloride: 99 mmol/L — ABNORMAL LOW (ref 101–111)
Creatinine, Ser: 1.29 mg/dL — ABNORMAL HIGH (ref 0.61–1.24)
GFR, EST AFRICAN AMERICAN: 56 mL/min — AB (ref >60)
GFR, EST NON AFRICAN AMERICAN: 48 mL/min — AB (ref >60)
Glucose, Bld: 89 mg/dL (ref 65–99)
POTASSIUM: 3 mmol/L — AB (ref 3.5–5.1)
Sodium: 135 mmol/L (ref 135–145)
TOTAL PROTEIN: 6.2 g/dL — AB (ref 6.5–8.1)

## 2017-08-09 NOTE — Discharge Summary (Signed)
Patient ID: Xavier Foster MRN: [id number] DOB/AGE: [birth year] 81 y.o.  Admit date: 08/07/2017 Discharge date: 08/09/2017  Discharge Diagnoses Patient Active Problem List   Diagnosis Date Noted  . Cholecystitis 08/07/2017    Consultants None  Procedures Laparoscopic cholecystectomy 08/08/17 for cholecystitis  Hospital Course: Taken to the OR 08/08/17 and underwent the above procedure. He recovered well from this. On POD#1, he was tolerating a diet, ambulating on his own and his pain was controlled with oral analgesics. He was deemed stable for discharge home    Allergies as of 08/09/2017      Reactions   Claritin [loratadine] Hives      Medication List    TAKE these medications   amLODipine 5 MG tablet Commonly known as:  NORVASC Take 5 mg by mouth daily.   atorvastatin 10 MG tablet Commonly known as:  LIPITOR Take 10 mg by mouth at bedtime.   HYDROcodone-acetaminophen 5-325 MG tablet Commonly known as:  NORCO/VICODIN Take 1 tablet by mouth See admin instructions. 1 tablet every four to six hours as needed for pain   meloxicam 15 MG tablet Commonly known as:  MOBIC Take 15 mg by mouth daily as needed (for lower back or hip pain).   promethazine 25 MG tablet Commonly known as:  PHENERGAN Take 25 mg by mouth every 8 (eight) hours as needed for nausea or vomiting.        Follow-up Information    [facility].   Specialty:  General Surgery Why:  Office will call with follow up next week.  Be at the office 30 minutes early for check in.  Bring photo ID and insurance information. Contact information: [address] [place] [address] [phone]           Teffany Blaszczyk M. Dema Severin, M.D. [facility]

## 2017-08-09 NOTE — Progress Notes (Signed)
Subjective No acute events. Feeling well. Some soreness at umbilicus. +Flatus. Tolerating liquids without n/v.  Objective: Vital signs in last 24 hours: Temp:  [97.3 F (36.3 C)-99.1 F (37.3 C)] 99.1 F (37.3 C) (12/28 0819) Pulse Rate:  [79-95] 80 (12/28 0819) Resp:  [18-21] 18 (12/28 0819) BP: (131-181)/(65-95) 131/65 (12/28 0819) SpO2:  [89 %-99 %] 92 % (12/28 0819) Last BM Date: 08/08/17  Intake/Output from previous day: 12/27 0701 - 12/28 0700 In: 1200 [P.O.:50; I.V.:1150] Out: 43 [Urine:400; Blood:20] Intake/Output this shift: Total I/O In: 480 [P.O.:480] Out: -   Gen: NAD, comfortable CV: RRR Pulm: Normal work of breathing Abd: Soft, NT/ND; incisions c/d/i without erythema. Ext: SCDs in place  Lab Results: CBC  Recent Labs    08/08/17 0846 08/09/17 0610  WBC 9.0 9.0  HGB 13.6 12.9*  HCT 39.6 38.7*  PLT 184 147*   BMET Recent Labs    08/08/17 0846 08/09/17 0610  NA 135 135  K 3.2* 3.0*  CL 99* 99*  CO2 23 27  GLUCOSE 105* 89  BUN 20 17  CREATININE 1.27* 1.29*  CALCIUM 9.2 8.7*    Anti-infectives: Anti-infectives (From admission, onward)   Start     Dose/Rate Route Frequency Ordered Stop   08/07/17 2300  cefTRIAXone (ROCEPHIN) 2 g in dextrose 5 % 50 mL IVPB  Status:  Discontinued     2 g 100 mL/hr over 30 Minutes Intravenous Daily at bedtime 08/07/17 2044 08/08/17 1814       Assessment/Plan: Patient Active Problem List   Diagnosis Date Noted  . Cholecystitis 08/07/2017   s/p Procedure(s): LAPAROSCOPIC CHOLECYSTECTOMY 08/08/2017  -Diet as tolerated -Ambulate 5x/day -IS 10x/hr while awake -PPx: SQH, SCDs -Dispo: Possible discharge later today if tolerating regular diet and pain controlled   LOS: 0 days   Sharon Mt. Dema Severin, M.D. General and Colorectal Surgery [facility]

## 2017-08-09 NOTE — Plan of Care (Signed)
  Progressing Education: Knowledge of General Education information will improve 08/09/2017 1023 - Progressing by Rance Muir, RN Health Behavior/Discharge Planning: Ability to manage health-related needs will improve 08/09/2017 1023 - Progressing by Rance Muir, RN Clinical Measurements: Ability to maintain clinical measurements within normal limits will improve 08/09/2017 1023 - Progressing by Rance Muir, RN Will remain free from infection 08/09/2017 1023 - Progressing by Rance Muir, RN Diagnostic test results will improve 08/09/2017 1023 - Progressing by Rance Muir, RN Respiratory complications will improve 08/09/2017 1023 - Progressing by Rance Muir, RN Cardiovascular complication will be avoided 08/09/2017 1023 - Progressing by Rance Muir, RN Activity: Risk for activity intolerance will decrease 08/09/2017 1023 - Progressing by Rance Muir, RN Nutrition: Adequate nutrition will be maintained 08/09/2017 1023 - Progressing by Rance Muir, RN Coping: Level of anxiety will decrease 08/09/2017 1023 - Progressing by Rance Muir, RN Elimination: Will not experience complications related to bowel motility 08/09/2017 1023 - Progressing by Rance Muir, RN Will not experience complications related to urinary retention 08/09/2017 1023 - Progressing by Rance Muir, RN Pain Managment: General experience of comfort will improve 08/09/2017 1023 - Progressing by Rance Muir, RN Safety: Ability to remain free from injury will improve 08/09/2017 1023 - Progressing by Rance Muir, RN Skin Integrity: Risk for impaired skin integrity will decrease 08/09/2017 1023 - Progressing by Rance Muir, RN

## 2017-08-09 NOTE — Discharge Instructions (Addendum)
Cholecystitis Cholecystitis is swelling and irritation (inflammation) of the gallbladder. The gallbladder is an organ that is shaped like a pear. It is under the liver on the right side of the body. This condition is often caused by gallstones. You doctor may do tests to see how your gallbladder works. These tests may include:  Imaging tests, such as: ? An ultrasound. ? MRI.  Tests that check how your liver works.  This condition needs treatment. Follow these instructions at home: Home care will depend on your treatment. In general:  Take over-the-counter and prescription medicines only as told by your doctor.  If you were prescribed an antibiotic medicine, take it as told by your doctor. Do not stop taking the antibiotic even if you start to feel better.  Follow instructions from your doctor about what to eat or drink. When you are allowed to eat, avoid eating or drinking anything that causes your symptoms to start.  Keep all follow-up visits as told by your doctor. This is important.  Contact a doctor if:  You have pain and your medicine does not help.  You have a fever. Get help right away if:  Your pain moves to: ? Another part of your belly (abdomen). ? Your back.  Your symptoms do not go away.  You have new symptoms. This information is not intended to replace advice given to you by your health care provider. Make sure you discuss any questions you have with your health care provider. Document Released: 07/19/2011 Document Revised: 01/05/2016 Document Reviewed: 11/10/2014 Elsevier Interactive Patient Education  2018 [place] After Refer to this sheet in the next few weeks. These instructions provide you with information about caring for yourself after your procedure. Your health care provider may also give you more specific instructions. Your treatment has been planned according to current medical practices, but problems sometimes  occur. Call your health care provider if you have any problems or questions after your procedure. What can I expect after the procedure? After your procedure, it is common to have soreness near the site of your drainage tube (catheter) or your incision. Follow these instructions at home: Incision care  Follow instructions from your health care provider about how to take care of your incision. Make sure you  ? Leave stitches (sutures), skin glue, or adhesive strips in place. These skin closures may need to be in place for 2 weeks or longer  Check your incision and your drainage site every day for signs of infection. Watch for: ? Redness, swelling, or pain. ? Fluid, blood, or pus. General instructions  Ok to bathe normally with soap/water over incisions  Diet as tolerated  Take over-the-counter and prescription medicines only as told by your health care provider.  Keep all follow-up visits as told by your health care provider. This is important. Contact a health care provider if:  You have redness, swelling, or pain at your incision or drainage site.  You have nausea or vomiting. Get help right away if:  Your abdominal pain gets worse.  You feel dizzy or you faint while standing.  You have fluid, blood, or pus coming from your incision or drainage site.  You have a fever.  You have shortness of breath.  You have a rapid heartbeat.  Your nausea or vomiting does not go away.  Your drainage tube becomes blocked.  Your drainage tube comes out of your abdomen. This information is not intended to replace advice given  to you by your health care provider. Make sure you discuss any questions you have with your health care provider. Document Released: 04/20/2015 Document Revised: 01/05/2016 Document Reviewed: 11/10/2014 Elsevier Interactive Patient Education  2018 [place] ______[facility] LAPAROSCOPIC SURGERY: POST OP INSTRUCTIONS Always review  your discharge instruction sheet given to you by the facility where your surgery was performed. IF YOU HAVE DISABILITY OR FAMILY LEAVE FORMS, YOU MUST BRING THEM TO THE OFFICE FOR PROCESSING.   DO NOT GIVE THEM TO YOUR DOCTOR.  1. A prescription for pain medication may be given to you upon discharge.  Take your pain medication as prescribed, if needed.  If narcotic pain medicine is not needed, then you may take acetaminophen (Tylenol) or ibuprofen (Advil) as needed. 2. Take your usually prescribed medications unless otherwise directed. 3. If you need a refill on your pain medication, please contact your pharmacy.  They will contact our office to request authorization. Prescriptions will not be filled after 5pm or on week-ends. 4. You should follow a light diet the first few days after arrival home, such as soup and crackers, etc.  Be sure to include lots of fluids daily. 5. Most patients will experience some swelling and bruising in the area of the incisions.  Ice packs will help.  Swelling and bruising can take several days to resolve.  6. It is common to experience some constipation if taking pain medication after surgery.  Increasing fluid intake and taking a stool softener (such as Colace) will usually help or prevent this problem from occurring.  A mild laxative (Milk of Magnesia or Miralax) should be taken according to package instructions if there are no bowel movements after 48 hours. 7. Unless discharge instructions indicate otherwise, you may remove your bandages 24-48 hours after surgery, and you may shower at that time.  You may have steri-strips (small skin tapes) in place directly over the incision.  These strips should be left on the skin for 7-10 days.  If your surgeon used skin glue on the incision, you may shower in 24 hours.  The glue will flake off over the next 2-3 weeks.  Any sutures or staples will be removed at the office during your follow-up visit. 8. ACTIVITIES:  You may resume  regular (light) daily activities beginning the next day--such as daily self-care, walking, climbing stairs--gradually increasing activities as tolerated.  You may have sexual intercourse when it is comfortable.  Refrain from any heavy lifting or straining until approved by your doctor. a. You may drive when you are no longer taking prescription pain medication, you can comfortably wear a seatbelt, and you can safely maneuver your car and apply brakes. b. RETURN TO WORK:  __________________________________________________________ 9. You should see your doctor in the office for a follow-up appointment approximately 2-3 weeks after your surgery.  Make sure that you call for this appointment within a day or two after you arrive home to insure a convenient appointment time. 10. OTHER INSTRUCTIONS: __________________________________________________________________________________________________________________________ __________________________________________________________________________________________________________________________ WHEN TO CALL YOUR DOCTOR: 1. Fever over 101.0 2. Inability to urinate 3. Continued bleeding from incision. 4. Increased pain, redness, or drainage from the incision. 5. Increasing abdominal pain  The clinic staff is available to answer your questions during regular business hours.  Please dont hesitate to call and ask to speak to one of the nurses for clinical concerns.  If you have a medical emergency, go to the nearest emergency room or call 911.  A surgeon from  [facility] is always on call at the hospital. [address], [place], [place], [place]  [address] ? [address], [place], [place]   [address] [phone] ? [phone] ? FAX [phone] Web site: [email or web address]

## 2017-08-09 NOTE — Progress Notes (Signed)
Pt tolerated some lunch but has not bee walking much, family afraid to take him home right now.  Ask to stay another day.  Will review with DR. White.

## 2017-08-10 NOTE — Care Management Note (Signed)
Case Management Note  Patient Details  Name: Xavier Foster MRN: [id number] Date of Birth: 1928-09-14  Subjective/Objective:                 Patient with order to DC to home today. Chart reviewed. No Home Health or Equipment needs, no unacknowledged Case Management consults or medication needs identified at the time of this note. Plan for DC to home.  CM signing off. If new needs arise today prior to discharge, please call Carles Collet RN CM at [phone].   Action/Plan:   Expected Discharge Date:  08/10/17               Expected Discharge Plan:  Home/Self Care  In-House Referral:     Discharge planning Services  CM Consult  Post Acute Care Choice:    Choice offered to:     DME Arranged:    DME Agency:     HH Arranged:    HH Agency:     Status of Service:  Completed, signed off  If discussed at [employer] of Stay Meetings, dates discussed:    Additional Comments:  Carles Collet, RN 08/10/2017, 10:18 AM

## 2017-08-10 NOTE — Progress Notes (Signed)
d/c to home with family .VSS pain 6/10 pain med given for ride home.  Breathing regular and unlabored on room air.  D/c instructions given and explained to patient and family with understanding.  incisions CDI

## 2017-08-10 NOTE — Progress Notes (Signed)
Looks great and should be able to go home.  Still slightly distended, but passing gass.  No bowel movement but has great bowel sounds.  Kathryne Eriksson. Dahlia Bailiff, MD, [place] [phone] [facility]

## 2020-07-25 ENCOUNTER — Other Ambulatory Visit: Payer: Self-pay

## 2020-07-25 ENCOUNTER — Emergency Department: Payer: Medicare Other

## 2020-07-25 ENCOUNTER — Inpatient Hospital Stay
Admission: EM | Admit: 2020-07-25 | Discharge: 2020-07-27 | DRG: 377 | Disposition: A | Discharge status: Home / Self Care | Payer: Medicare Other | Attending: Family Medicine | Admitting: Family Medicine

## 2020-07-25 DIAGNOSIS — R269 Unspecified abnormalities of gait and mobility: Secondary | ICD-10-CM | POA: Diagnosis present

## 2020-07-25 DIAGNOSIS — K5909 Other constipation: Secondary | ICD-10-CM | POA: Diagnosis present

## 2020-07-25 DIAGNOSIS — Z888 Allergy status to other drugs, medicaments and biological substances status: Secondary | ICD-10-CM

## 2020-07-25 DIAGNOSIS — K449 Diaphragmatic hernia without obstruction or gangrene: Secondary | ICD-10-CM | POA: Diagnosis present

## 2020-07-25 DIAGNOSIS — N189 Chronic kidney disease, unspecified: Secondary | ICD-10-CM

## 2020-07-25 DIAGNOSIS — K5730 Diverticulosis of large intestine without perforation or abscess without bleeding: Secondary | ICD-10-CM | POA: Diagnosis present

## 2020-07-25 DIAGNOSIS — E538 Deficiency of other specified B group vitamins: Secondary | ICD-10-CM | POA: Diagnosis present

## 2020-07-25 DIAGNOSIS — W1812XA Fall from or off toilet with subsequent striking against object, initial encounter: Secondary | ICD-10-CM | POA: Diagnosis present

## 2020-07-25 DIAGNOSIS — Z79899 Other long term (current) drug therapy: Secondary | ICD-10-CM

## 2020-07-25 DIAGNOSIS — M109 Gout, unspecified: Secondary | ICD-10-CM | POA: Diagnosis present

## 2020-07-25 DIAGNOSIS — E785 Hyperlipidemia, unspecified: Secondary | ICD-10-CM

## 2020-07-25 DIAGNOSIS — D62 Acute posthemorrhagic anemia: Secondary | ICD-10-CM | POA: Diagnosis present

## 2020-07-25 DIAGNOSIS — R680 Hypothermia, not associated with low environmental temperature: Secondary | ICD-10-CM | POA: Diagnosis present

## 2020-07-25 DIAGNOSIS — Z791 Long term (current) use of non-steroidal anti-inflammatories (NSAID): Secondary | ICD-10-CM

## 2020-07-25 DIAGNOSIS — K635 Polyp of colon: Secondary | ICD-10-CM | POA: Diagnosis present

## 2020-07-25 DIAGNOSIS — I129 Hypertensive chronic kidney disease with stage 1 through stage 4 chronic kidney disease, or unspecified chronic kidney disease: Secondary | ICD-10-CM | POA: Diagnosis present

## 2020-07-25 DIAGNOSIS — N1832 Chronic kidney disease, stage 3b: Secondary | ICD-10-CM | POA: Diagnosis present

## 2020-07-25 DIAGNOSIS — Y92002 Bathroom of unspecified non-institutional (private) residence single-family (private) house as the place of occurrence of the external cause: Secondary | ICD-10-CM

## 2020-07-25 DIAGNOSIS — R578 Other shock: Secondary | ICD-10-CM

## 2020-07-25 DIAGNOSIS — R42 Dizziness and giddiness: Secondary | ICD-10-CM

## 2020-07-25 DIAGNOSIS — N179 Acute kidney failure, unspecified: Secondary | ICD-10-CM | POA: Diagnosis present

## 2020-07-25 DIAGNOSIS — K219 Gastro-esophageal reflux disease without esophagitis: Secondary | ICD-10-CM | POA: Diagnosis present

## 2020-07-25 DIAGNOSIS — R112 Nausea with vomiting, unspecified: Secondary | ICD-10-CM | POA: Diagnosis present

## 2020-07-25 DIAGNOSIS — M1990 Unspecified osteoarthritis, unspecified site: Secondary | ICD-10-CM

## 2020-07-25 DIAGNOSIS — K641 Second degree hemorrhoids: Secondary | ICD-10-CM | POA: Diagnosis present

## 2020-07-25 DIAGNOSIS — E860 Dehydration: Secondary | ICD-10-CM | POA: Diagnosis present

## 2020-07-25 DIAGNOSIS — I10 Essential (primary) hypertension: Secondary | ICD-10-CM

## 2020-07-25 DIAGNOSIS — R55 Syncope and collapse: Secondary | ICD-10-CM | POA: Diagnosis present

## 2020-07-25 DIAGNOSIS — K625 Hemorrhage of anus and rectum: Secondary | ICD-10-CM | POA: Diagnosis present

## 2020-07-25 DIAGNOSIS — Z9049 Acquired absence of other specified parts of digestive tract: Secondary | ICD-10-CM | POA: Diagnosis not present

## 2020-07-25 DIAGNOSIS — K921 Melena: Secondary | ICD-10-CM | POA: Diagnosis present

## 2020-07-25 DIAGNOSIS — N1831 Chronic kidney disease, stage 3a: Secondary | ICD-10-CM | POA: Insufficient documentation

## 2020-07-25 DIAGNOSIS — Z20822 Contact with and (suspected) exposure to covid-19: Secondary | ICD-10-CM | POA: Diagnosis present

## 2020-07-25 DIAGNOSIS — D649 Anemia, unspecified: Secondary | ICD-10-CM

## 2020-07-25 DIAGNOSIS — K922 Gastrointestinal hemorrhage, unspecified: Secondary | ICD-10-CM

## 2020-07-25 LAB — CBC WITH DIFFERENTIAL/PLATELET
Abs Immature Granulocytes: 0.14 10*3/uL — ABNORMAL HIGH (ref 0.00–0.07)
Abs Immature Granulocytes: 0.01 10*3/uL (ref 0.00–0.07)
Basophils Absolute: 0.1 10*3/uL (ref 0.0–0.1)
Basophils Absolute: 0 10*3/uL (ref 0.0–0.1)
Basophils Relative: 0 %
Basophils Relative: 0 %
Eosinophils Absolute: 0.2 10*3/uL (ref 0.0–0.5)
Eosinophils Absolute: 0 10*3/uL (ref 0.0–0.5)
Eosinophils Relative: 1 %
Eosinophils Relative: 0 %
HCT: 28.4 % — ABNORMAL LOW (ref 39.0–52.0)
HCT: 38.8 % — ABNORMAL LOW (ref 39.0–52.0)
Hemoglobin: 9.5 g/dL — ABNORMAL LOW (ref 13.0–17.0)
Hemoglobin: 13.2 g/dL (ref 13.0–17.0)
Immature Granulocytes: 1 %
Immature Granulocytes: 0 %
Lymphocytes Relative: 16 %
Lymphocytes Relative: 9 %
Lymphs Abs: 2.7 10*3/uL (ref 0.7–4.0)
Lymphs Abs: 0.3 10*3/uL — ABNORMAL LOW (ref 0.7–4.0)
MCH: 29.7 pg (ref 26.0–34.0)
MCH: 29.7 pg (ref 26.0–34.0)
MCHC: 33.5 g/dL (ref 30.0–36.0)
MCHC: 34 g/dL (ref 30.0–36.0)
MCV: 88.8 fL (ref 80.0–100.0)
MCV: 87.4 fL (ref 80.0–100.0)
Monocytes Absolute: 1.2 10*3/uL — ABNORMAL HIGH (ref 0.1–1.0)
Monocytes Absolute: 0.2 10*3/uL (ref 0.1–1.0)
Monocytes Relative: 7 %
Monocytes Relative: 4 %
Neutro Abs: 12.4 10*3/uL — ABNORMAL HIGH (ref 1.7–7.7)
Neutro Abs: 3.3 10*3/uL (ref 1.7–7.7)
Neutrophils Relative %: 75 %
Neutrophils Relative %: 87 %
Platelets: 218 10*3/uL (ref 150–400)
Platelets: 169 10*3/uL (ref 150–400)
RBC: 3.2 MIL/uL — ABNORMAL LOW (ref 4.22–5.81)
RBC: 4.44 MIL/uL (ref 4.22–5.81)
RDW: 14.6 % (ref 11.5–15.5)
RDW: 14.5 % (ref 11.5–15.5)
WBC: 16.8 10*3/uL — ABNORMAL HIGH (ref 4.0–10.5)
WBC: 3.8 10*3/uL — ABNORMAL LOW (ref 4.0–10.5)
nRBC: 0 % (ref 0.0–0.2)
nRBC: 0 % (ref 0.0–0.2)

## 2020-07-25 LAB — COMPREHENSIVE METABOLIC PANEL
ALT: 15 U/L (ref 0–44)
ALT: 15 U/L (ref 0–44)
AST: 20 U/L (ref 15–41)
AST: 26 U/L (ref 15–41)
Albumin: 3.7 g/dL (ref 3.5–5.0)
Albumin: 3.4 g/dL — ABNORMAL LOW (ref 3.5–5.0)
Alkaline Phosphatase: 47 U/L (ref 38–126)
Alkaline Phosphatase: 47 U/L (ref 38–126)
Anion gap: 12 (ref 5–15)
Anion gap: 13 (ref 5–15)
BUN: 50 mg/dL — ABNORMAL HIGH (ref 8–23)
BUN: 41 mg/dL — ABNORMAL HIGH (ref 8–23)
CO2: 21 mmol/L — ABNORMAL LOW (ref 22–32)
CO2: 21 mmol/L — ABNORMAL LOW (ref 22–32)
Calcium: 8.6 mg/dL — ABNORMAL LOW (ref 8.9–10.3)
Calcium: 8.2 mg/dL — ABNORMAL LOW (ref 8.9–10.3)
Chloride: 103 mmol/L (ref 98–111)
Chloride: 106 mmol/L (ref 98–111)
Creatinine, Ser: 1.98 mg/dL — ABNORMAL HIGH (ref 0.61–1.24)
Creatinine, Ser: 1.74 mg/dL — ABNORMAL HIGH (ref 0.61–1.24)
GFR, Estimated: 32 mL/min — ABNORMAL LOW (ref >60)
GFR, Estimated: 37 mL/min — ABNORMAL LOW (ref >60)
Glucose, Bld: 221 mg/dL — ABNORMAL HIGH (ref 70–99)
Glucose, Bld: 169 mg/dL — ABNORMAL HIGH (ref 70–99)
Potassium: 3.2 mmol/L — ABNORMAL LOW (ref 3.5–5.1)
Potassium: 3.3 mmol/L — ABNORMAL LOW (ref 3.5–5.1)
Sodium: 136 mmol/L (ref 135–145)
Sodium: 140 mmol/L (ref 135–145)
Total Bilirubin: 0.8 mg/dL (ref 0.3–1.2)
Total Bilirubin: 1.5 mg/dL — ABNORMAL HIGH (ref 0.3–1.2)
Total Protein: 6.3 g/dL — ABNORMAL LOW (ref 6.5–8.1)
Total Protein: 5.8 g/dL — ABNORMAL LOW (ref 6.5–8.1)

## 2020-07-25 LAB — MAGNESIUM: Magnesium: 1.8 mg/dL (ref 1.7–2.4)

## 2020-07-25 LAB — ABO/RH: ABO/RH(D): O NEG

## 2020-07-25 LAB — URINALYSIS, COMPLETE (UACMP) WITH MICROSCOPIC
Bacteria, UA: NONE SEEN
Bilirubin Urine: NEGATIVE
Glucose, UA: 50 mg/dL — AB
Hgb urine dipstick: NEGATIVE
Ketones, ur: 5 mg/dL — AB
Leukocytes,Ua: NEGATIVE
Nitrite: NEGATIVE
Protein, ur: NEGATIVE mg/dL
Specific Gravity, Urine: 1.02 (ref 1.005–1.030)
Squamous Epithelial / HPF: NONE SEEN (ref 0–5)
pH: 5 (ref 5.0–8.0)

## 2020-07-25 LAB — RESP PANEL BY RT-PCR (FLU A&B, COVID) ARPGX2
Influenza A by PCR: NEGATIVE
Influenza B by PCR: NEGATIVE
SARS Coronavirus 2 by RT PCR: NEGATIVE

## 2020-07-25 LAB — PHOSPHORUS: Phosphorus: 4 mg/dL (ref 2.5–4.6)

## 2020-07-25 LAB — PROTIME-INR
INR: 1 (ref 0.8–1.2)
Prothrombin Time: 13.2 seconds (ref 11.4–15.2)

## 2020-07-25 LAB — PREPARE RBC (CROSSMATCH)

## 2020-07-25 MED ORDER — LACTATED RINGERS IV BOLUS
1000.0000 mL | Freq: Once | INTRAVENOUS | Status: AC
Start: 2020-07-25 — End: 2020-07-25
  Administered 2020-07-25: 01:00:00 1000 mL via INTRAVENOUS

## 2020-07-25 MED ORDER — SODIUM CHLORIDE 0.9 % IV BOLUS
1000.0000 mL | Freq: Once | INTRAVENOUS | Status: AC
  Administered 2020-07-25: 02:00:00 1000 mL via INTRAVENOUS

## 2020-07-25 MED ORDER — PEG 3350-KCL-NA BICARB-NACL 420 G PO SOLR
4000.0000 mL | Freq: Once | ORAL | Status: AC
  Administered 2020-07-25: 19:00:00 4000 mL via ORAL
  Filled 2020-07-25: qty 4000

## 2020-07-25 MED ORDER — ACETAMINOPHEN 10 MG/ML IV SOLN
1000.0000 mg | INTRAVENOUS | Status: AC
  Administered 2020-07-25: 05:00:00 1000 mg via INTRAVENOUS
  Filled 2020-07-25: qty 100

## 2020-07-25 MED ORDER — PANTOPRAZOLE SODIUM 40 MG IV SOLR
40.0000 mg | Freq: Two times a day (BID) | INTRAVENOUS | Status: DC
  Administered 2020-07-25 – 2020-07-27 (×5): 40 mg via INTRAVENOUS
  Filled 2020-07-25 (×5): qty 40

## 2020-07-25 MED ORDER — ACETAMINOPHEN 325 MG PO TABS: 650.0000 mg | ORAL_TABLET | Freq: Four times a day (QID) | ORAL | Status: DC | PRN

## 2020-07-25 MED ORDER — PANTOPRAZOLE SODIUM 40 MG IV SOLR
40.0000 mg | Freq: Once | INTRAVENOUS | Status: AC
  Administered 2020-07-25: 01:00:00 40 mg via INTRAVENOUS
  Filled 2020-07-25: qty 40

## 2020-07-25 MED ORDER — HALOPERIDOL LACTATE 5 MG/ML IJ SOLN
2.5000 mg | Freq: Once | INTRAMUSCULAR | Status: AC
  Administered 2020-07-25: 03:00:00 2.5 mg via INTRAVENOUS
  Filled 2020-07-25: qty 1

## 2020-07-25 MED ORDER — POTASSIUM CHLORIDE CRYS ER 20 MEQ PO TBCR
40.0000 meq | EXTENDED_RELEASE_TABLET | Freq: Two times a day (BID) | ORAL | Status: AC
  Administered 2020-07-25 (×2): 40 meq via ORAL
  Filled 2020-07-25 (×2): qty 2

## 2020-07-25 MED ORDER — IOHEXOL 350 MG/ML SOLN
75.0000 mL | Freq: Once | INTRAVENOUS | Status: AC | PRN
  Administered 2020-07-25: 03:00:00 75 mL via INTRAVENOUS

## 2020-07-25 MED ORDER — METOCLOPRAMIDE HCL 5 MG/ML IJ SOLN
10.0000 mg | Freq: Once | INTRAMUSCULAR | Status: AC
  Administered 2020-07-25: 02:00:00 10 mg via INTRAVENOUS
  Filled 2020-07-25: qty 2

## 2020-07-25 MED ORDER — ACETAMINOPHEN 650 MG RE SUPP: 650.0000 mg | Freq: Four times a day (QID) | RECTAL | Status: DC | PRN

## 2020-07-25 MED ORDER — ONDANSETRON HCL 4 MG PO TABS: 4.0000 mg | ORAL_TABLET | Freq: Four times a day (QID) | ORAL | Status: DC | PRN

## 2020-07-25 MED ORDER — SODIUM CHLORIDE 0.9 % IV SOLN: 10.0000 mL/h | Freq: Once | INTRAVENOUS | Status: AC

## 2020-07-25 MED ORDER — ONDANSETRON HCL 4 MG/2ML IJ SOLN
4.0000 mg | Freq: Once | INTRAMUSCULAR | Status: AC
  Administered 2020-07-25: 01:00:00 4 mg via INTRAVENOUS

## 2020-07-25 MED ORDER — SODIUM CHLORIDE 0.9% IV SOLUTION
Freq: Once | INTRAVENOUS | Status: DC
  Filled 2020-07-25: qty 250

## 2020-07-25 MED ORDER — ONDANSETRON HCL 4 MG/2ML IJ SOLN: 4.0000 mg | Freq: Four times a day (QID) | INTRAMUSCULAR | Status: DC | PRN

## 2020-07-25 NOTE — ED Notes (Signed)
Patient transported to CT with RN on stretcher. Full cardiac monitor in place.

## 2020-07-25 NOTE — ED Notes (Signed)
Patient reporting nausea again and dry heaving. MD made aware.

## 2020-07-25 NOTE — Progress Notes (Signed)
OT Cancellation Note  Patient Details Name: Xavier Foster MRN: [id number] DOB: 02/13/1929   Cancelled Treatment:    Reason Eval/Treat Not Completed: Patient at procedure or test/ unavailable. Order received and chart reviewed. OT attempted x2 to see pt for evaluation this date. On both attempts, pt with caregiver and NP in room discussing care. Will hold OT evaluation at this time and re-attempt next date as available and pt medically appropriate for therapy evaluation.   Shara Blazing, M.S., OTR/L Ascom: [phone] 07/25/20, 1:49 PM

## 2020-07-25 NOTE — ED Notes (Signed)
Awaiting IV tylenol.

## 2020-07-25 NOTE — ED Notes (Addendum)
Pt transported to CT on stretcher by this RN and Education administrator. Cardiac monitor in place.

## 2020-07-25 NOTE — ED Notes (Signed)
Patient denies pain and is resting comfortably. Minimal ice chips provided.

## 2020-07-25 NOTE — ED Notes (Signed)
Pt transported back to room with this RN and Education administrator from [place].

## 2020-07-25 NOTE — ED Notes (Signed)
Bedside commode placed by patient. Son instructed on how to ambulate patient to commode. Advised to hit callbell when needing assistance to toilet. Patient began using PO medication for colonoscopy.

## 2020-07-25 NOTE — ED Notes (Addendum)
Patient experienced 1 episode of bloody stool. Mushy in consistency. No clots noted. Stoll dark brown with large red streaks. Patient cleaned and new brief applied. Linens on bed changed. Patient positioned in bed in slight reverse trendelenburg with knees elevated. HOB elevated 60 degrees. Patient noted to be pale again and with increased WOB. Patient instructed to regulate breathing by breathing in through nose and out through mouth. Pt demonstrated compliance with breathing technique.  Patient temperature now 97.8 so bear hugger removed and warm blankets applied. Full cardiac monitor remains in place. Patient's daughter at bedside.

## 2020-07-25 NOTE — H&P (Signed)
History and Physical    Xavier Foster XBM:[id number] DOB: 06/05/1929 DOA: 07/25/2020  PCP: Maryland Pink, MD   Patient coming from: Home  I have personally briefly reviewed patient's old medical records in [place]  Chief Complaint: Rectal bleeding x3 days, near syncope,  HPI: Xavier Foster is a 86 y.o. male with medical history significant for HTN, [name], gout and arthritis on meloxicam, as well as constipation and bleeding hemorrhoids brought in by EMS following a fall preceded by dizziness as he attempted to get up off the commode.Marland Kitchen He his head but did not lose consciousness. Patient has been passing bright red blood per rectum intermittently for the past 3 days which she initially thought was related to straining to have a bowel movement however the episodes became more frequent and heavier and with clots.  He denies black tarry stool.Denies abdominal pain. He denies chest pain or shortness of breath or palpitations but feels very weak and fatigued.  Daughter at bedside contributes to history.  While being transported by EMS he had a single emesis of clear gastric contents. ED Course: On arrival, he was afebrile, mildly hypothermic at 94.7, heart rate 70 with BP 166/65 O2 sat 96% on room air. During his ED course he became more tachycardic up to 140 but this improved with IV fluid bolus. He also reportedly had a bradycardic event while in the ER. Blood work significant for hemoglobin of 9.5, down from his baseline of 13.6 in July on [facility]. Creatinine 1.98 above his baseline of 1.2 in July 2021. BUN 50, glucose 221, potassium 3.2. EKG as reviewed by me : Normal sinus rhythm at 52 with no acute ST-T wave changes Imaging: Chest x-ray clear, head CT with no acute trauma. CT angio abdomen and pelvis showed no focal contrast extravasation seen to localize GI bleed. Extensive sigmoid diverticulosis Patient was treated with an IV fluid bolus and started on transfusion of  one of 2 units. Hospitalist consulted for admission.  Review of Systems: As per HPI otherwise all other systems on review of systems negative.    Past Medical History:  Diagnosis Date  . High cholesterol   . Hypertension     Past Surgical History:  Procedure Laterality Date  . CHOLECYSTECTOMY N/A 08/08/2017   Procedure: LAPAROSCOPIC CHOLECYSTECTOMY;  Surgeon: Ileana Roup, MD;  Location: [facility];  Service: General;  Laterality: N/A;     reports that he has never smoked. He has never used smokeless tobacco. He reports that he does not drink alcohol and does not use drugs.  Allergies  Allergen Reactions  . Claritin [Loratadine] Hives    History reviewed. No pertinent family history.    Prior to Admission medications   Medication Sig Start Date End Date Taking? Authorizing Provider  amLODipine (NORVASC) 5 MG tablet Take 5 mg by mouth daily. 06/14/17  Yes [provider]  atorvastatin (LIPITOR) 10 MG tablet Take 10 mg by mouth at bedtime. 06/28/17  Yes [provider]  latanoprost (XALATAN) 0.005 % ophthalmic solution Place 1 drop into both eyes at bedtime. 06/29/20  Yes [provider]  meloxicam (MOBIC) 15 MG tablet Take 15 mg by mouth daily as needed (for lower back or hip pain).  06/12/17   [provider]    Physical Exam: Vitals:   07/25/20 0245 07/25/20 0310 07/25/20 0315 07/25/20 0330  BP: (!) 144/82 (!) 157/46 (!) 147/77 (!) 162/96  Pulse: 88 94 95 (!) 107  Resp: 17   (!)  25  Temp: (!) 94.7 F (34.8 C) (!) 95.6 F (35.3 C) (!) 95.8 F (35.4 C) (!) 96.5 F (35.8 C)  TempSrc: Bladder     SpO2: 94% (!) 84% 95% 92%  Weight:      Height:         Vitals:   07/25/20 0245 07/25/20 0310 07/25/20 0315 07/25/20 0330  BP: (!) 144/82 (!) 157/46 (!) 147/77 (!) 162/96  Pulse: 88 94 95 (!) 107  Resp: 17   (!) 25  Temp: (!) 94.7 F (34.8 C) (!) 95.6 F (35.3 C) (!) 95.8 F (35.4 C) (!) 96.5 F (35.8 C)  TempSrc: Bladder      SpO2: 94% (!) 84% 95% 92%  Weight:      Height:          Constitutional:  Patient looks pale and unwell and oriented x 3 . Not in any apparent distress HEENT:      Head: Normocephalic and atraumatic.         Eyes: PERLA, EOMI, Conjunctivae are normal. Sclera is non-icteric.       Mouth/Throat: Mucous membranes are moist.       Neck: Supple with no signs of meningismus. Cardiovascular:  Tachycardic. No murmurs, gallops, or rubs. 2+ symmetrical distal pulses are present . No JVD. No LE edema Respiratory: Respiratory effort normal .Lungs sounds clear bilaterally. No wheezes, crackles, or rhonchi.  Gastrointestinal: Soft, non tender, and non distended with positive bowel sounds.  Genitourinary: No CVA tenderness. Musculoskeletal: Nontender with normal range of motion in all extremities. No cyanosis, or erythema of extremities. Neurologic:  Face is symmetric. Moving all extremities. No gross focal neurologic deficits . Skin: Skin is warm, dry.  No rash or ulcers Psychiatric: Mood and affect are normal    Labs on Admission: I have personally reviewed following labs and imaging studies  CBC: Recent Labs  Lab 07/25/20 0122  WBC 16.8*  NEUTROABS 12.4*  HGB 9.5*  HCT 28.4*  MCV 88.8  PLT 099   Basic Metabolic Panel: Recent Labs  Lab 07/25/20 0122  NA 136  K 3.2*  CL 103  CO2 21*  GLUCOSE 221*  BUN 50*  CREATININE 1.98*  CALCIUM 8.6*  MG 1.8  PHOS 4.0   GFR: Estimated Creatinine Clearance: 23.6 mL/min (A) (by C-G formula based on SCr of 1.98 mg/dL (H)). Liver Function Tests: Recent Labs  Lab 07/25/20 0122  AST 20  ALT 15  ALKPHOS 47  BILITOT 0.8  PROT 6.3*  ALBUMIN 3.7   No results for input(s): LIPASE, AMYLASE in the last 168 hours. No results for input(s): AMMONIA in the last 168 hours. Coagulation Profile: Recent Labs  Lab 07/25/20 0122  INR 1.0   Cardiac Enzymes: No results for input(s): CKTOTAL, CKMB, CKMBINDEX, TROPONINI in the last 168  hours. BNP (last 3 results) No results for input(s): PROBNP in the last 8760 hours. HbA1C: No results for input(s): HGBA1C in the last 72 hours. CBG: No results for input(s): GLUCAP in the last 168 hours. Lipid Profile: No results for input(s): CHOL, HDL, LDLCALC, TRIG, CHOLHDL, LDLDIRECT in the last 72 hours. Thyroid Function Tests: No results for input(s): TSH, T4TOTAL, FREET4, T3FREE, THYROIDAB in the last 72 hours. Anemia Panel: No results for input(s): VITAMINB12, FOLATE, FERRITIN, TIBC, IRON, RETICCTPCT in the last 72 hours. Urine analysis:    Component Value Date/Time   COLORURINE YELLOW 08/07/2017 1616   APPEARANCEUR CLEAR 08/07/2017 1616   LABSPEC 1.014 08/07/2017 1616   PHURINE  6.0 08/07/2017 1616   GLUCOSEU NEGATIVE 08/07/2017 1616   HGBUR SMALL (A) 08/07/2017 1616   BILIRUBINUR NEGATIVE 08/07/2017 1616   KETONESUR NEGATIVE 08/07/2017 1616   PROTEINUR 100 (A) 08/07/2017 1616   NITRITE NEGATIVE 08/07/2017 1616   LEUKOCYTESUR NEGATIVE 08/07/2017 1616    Radiological Exams on Admission: CT HEAD WO CONTRAST  Result Date: 07/25/2020 CLINICAL DATA:  Head trauma, emesis, incontinence EXAM: CT HEAD WITHOUT CONTRAST TECHNIQUE: Contiguous axial images were obtained from the base of the skull through the vertex without intravenous contrast. COMPARISON:  None. FINDINGS: Brain: Normal anatomic configuration. Parenchymal volume loss is commensurate with the patient's age. Mild periventricular white matter changes are present likely reflecting the sequela of small vessel ischemia. No abnormal intra or extra-axial mass lesion or fluid collection. No abnormal mass effect or midline shift. No evidence of acute intracranial hemorrhage or infarct. Ventricular size is normal. Cerebellum unremarkable. Vascular: No asymmetric hyperdense vasculature at the skull base. Skull: Intact Sinuses/Orbits: Paranasal sinuses are clear. Orbits are unremarkable. Other: Mastoid air cells and middle ear  cavities are clear. IMPRESSION: Mild senescent changes. No acute intracranial abnormality. No calvarial fracture. Electronically Signed   By: Fidela Salisbury MD   On: 07/25/2020 01:45   DG Chest Portable 1 View  Result Date: 07/25/2020 CLINICAL DATA:  Vomiting EXAM: PORTABLE CHEST 1 VIEW COMPARISON:  08/07/2017 FINDINGS: The heart size and mediastinal contours are within normal limits. Both lungs are clear. The visualized skeletal structures are unremarkable. IMPRESSION: No active disease. Electronically Signed   By: Fidela Salisbury MD   On: 07/25/2020 01:51   CT Angio Abd/Pel W and/or Wo Contrast  Result Date: 07/25/2020 CLINICAL DATA:  GI bleed EXAM: CTA ABDOMEN AND PELVIS WITHOUT AND WITH CONTRAST TECHNIQUE: Multidetector CT imaging of the abdomen and pelvis was performed using the standard protocol during bolus administration of intravenous contrast. Multiplanar reconstructed images and MIPs were obtained and reviewed to evaluate the vascular anatomy. CONTRAST:  44mL OMNIPAQUE IOHEXOL 350 MG/ML SOLN COMPARISON:  08/07/2017 FINDINGS: VASCULAR Aorta: Heavily calcified aorta.  No aneurysm. Celiac: Patent SMA: Patent Renals: 1 right renal artery and 2 left renal arteries, patent. IMA: Patent Inflow: Calcified common iliac arteries.  Patent. Proximal Outflow: Calcifications, patent. Veins: No obvious venous abnormality within the limitations of this arterial phase study. Review of the MIP images confirms the above findings. NON-VASCULAR Lower chest: No acute abnormality. Hepatobiliary: Scattered hypodensities in the liver compatible with cysts. Prior cholecystectomy. Pancreas: No focal abnormality or ductal dilatation. Spleen: No focal abnormality.  Normal size. Adrenals/Urinary Tract: Bilateral renal cysts. No hydronephrosis. Adrenal glands unremarkable. Urinary bladder decompressed with Foley catheter in place. Stomach/Bowel: Extensive sigmoid diverticulosis. No active diverticulitis. No visible active  contrast extravasation to localize GI bleed. Stomach and small bowel decompressed, unremarkable. Lymphatic: No adenopathy Reproductive: Prostate enlargement Other: No free fluid or free air. Musculoskeletal: No acute bony abnormality. Degenerative changes in the lumbar spine. IMPRESSION: VASCULAR No focal contrast extravasation seen to localize GI bleed. Extensive aortic and iliac calcifications. NON-VASCULAR Extensive sigmoid diverticulosis. Electronically Signed   By: Rolm Baptise M.D.   On: 07/25/2020 03:10     Assessment/Plan 86 year old male with history of HTN, [name], gout and arthritis on meloxicam presenting with a 3-day history of rectal bleeding and near syncopal event on the night of arrival..  Acute blood loss anemia secondary to GI bleed, hematochezia  Near syncope/symptomatic anemia Sigmoid diverticulosis/history of hemorrhoidal bleed/NSAID use -Patient with 3-day history of bright red blood  per rectum, plus  single emesis of clear gastric contents -Tachycardic but hypertensive and had a bradycardic episode at 54 in the ER -Hemoglobin 9.5, down from 13.6 in July 2021 -CT angio abdomen and pelvis does not localize GI bleed. Shows extensive sigmoid diverticulosis -Continue transfusion of 2 units PRBCs -Keep n.p.o. -IV Protonix, IV antiemetics non-NSAID pain meds -GI consult   HTN (hypertension) -Hold home antihypertensives. Labetalol IV as needed systolic over 855    [name] (hyperlipidemia) -Hold statins while n.p.o.    Gout/arthritis -Chronic and stable -Avoid NSAIDs    Acute kidney injury superimposed on CKD IIIa (HCC) -Creatinine 1.98 above baseline of 1.2 -IV hydration, monitor renal function and avoid nephrotoxins        DVT prophylaxis: SCDs Code Status: full code  Family Communication:  Daughter at bedside  Disposition Plan: Back to previous home environment Consults called: GI Status:.At the time of admission, it appears that the appropriate admission status  for this patient is INPATIENT. This is judged to be reasonable and necessary in order to provide the required intensity of service to ensure the patient's safety given the presenting symptoms, physical exam findings, and initial radiographic and laboratory data in the context of their  Comorbid conditions.   Patient requires inpatient status due to high intensity of service, high risk for further deterioration and high frequency of surveillance required.   I certify that at the point of admission it is my clinical judgment that the patient will require inpatient hospital care spanning beyond [place] MD [facility]     07/25/2020, 3:59 AM

## 2020-07-25 NOTE — ED Notes (Signed)
Pt rectal temp 94.8. Placed on bair hugger with additional warm blankets at this time.

## 2020-07-25 NOTE — ED Notes (Signed)
Warm blankets removed due to low grade temp 100.7 on bladder probe. Single sheet applied.

## 2020-07-25 NOTE — ED Triage Notes (Addendum)
BIB ambulance for emesis and GI bleed. pt reports falling in bathroom upon standing up and noticed frank red blood from rectum.  Pt with gross amount of frank red blood and clots. Incontinent of urine. Pt alert but notably fatigued and weak. MD at bedside. 4 mg zofran admin en route.

## 2020-07-25 NOTE — ED Notes (Signed)
Emergency blood units, 2 units O Pos completed at 0235.

## 2020-07-25 NOTE — ED Notes (Signed)
Pt reports feeling warm and short of breath. Respirations 28. Oxygen saturation 96 percent on 2L [place]. HOB elevated. Pt encouraged to take deep slow breaths. Pt educated on need for bair hugger to increase temperature. Pt verbalized understanding. Pt respirations decreased to 19 after deep slow breaths. Oxygen saturation remains 96 percent on 2L [place].

## 2020-07-25 NOTE — ED Notes (Signed)
Zol pads placed on pt. Pt showing signs of unresponsiveness with bradycardia down to 30s. MD at bedside. Pt family at bedside and updated. Blood consent obtained.

## 2020-07-25 NOTE — ED Notes (Signed)
Patient got up to bedside commode. Very dark stool, minimal. Patient very unsteady on feet. Cleaned patient up and back to bed. Removed 02 and patient maintained 96-98% saturation.

## 2020-07-25 NOTE — Consult Note (Signed)
Consultation  Referring Provider:     Dr Damita Dunnings Admit date  07/25/20 Consult date        07/25/20 Reason for Consultation:     Rectal bleeding         HPI:   Xavier Foster is a 86 y.o. male admitted today with painless rectal bleeding onset x 3d. Fell and hit head today while getting off commode. Hx of HTN/HL/gout/arthritis/GERD- on daily nexium at home. He is not on any chronic anticoagulation. His daughter is here with him who is his primary caretaker. They state patient has history of chronic constipation- takes daily miralax sometime otc stool softeners (no stimulant laxatives)- usually goes 3-4d in between bms. They report Friday he had 3 loose bm's with some brpr which they treated as hemorrhoids. No rectal bleeding Saturday. Sunday night developed some lower abdominal pain and went to the bathroom, rectal bleeding resumed in larger amounts more frequent- there was some dark red which turned into light red. There was some brown diarrhea mixed in.  Diarrhea lasted about an hour or so- several episodes.  Had one bloody stool here -about 230 am, no stools/ bleeding since then No Gerd/dysphagia. There was some associated NV Sunday night prior to defecated last night.  Emesis was light in color. Had take out Sunday lunch : baked chicken, vegetables. They report he has been taking meloxicam daily for knee pain for the last 64m.  Wbc 16/8. hgb 9.5, platelte normal. Bun 50, creatnine 1.98, gfr 32. Pt/inr unremarkable.  Has received IVF, 2u pRBCs, and been started on pantoprazole. He has been running a low grad temp since admission- originally was hypothermic CTA a/p- IMPRESSION: VASCULAR No focal contrast extravasation seen to localize GI bleed. Extensive aortic and iliac calcifications. NON-VASCULAR Extensive sigmoid diverticulosis.  PREVIOUS ENDOSCOPIES:            No colonoscopy- possible flexible sigmoidoscopy remotely  Past Medical History:  Diagnosis Date  . High  cholesterol   . Hypertension     Past Surgical History:  Procedure Laterality Date  . CHOLECYSTECTOMY N/A 08/08/2017   Procedure: LAPAROSCOPIC CHOLECYSTECTOMY;  Surgeon: Ileana Roup, MD;  Location: [place];  Service: General;  Laterality: N/A;    History reviewed. No pertinent family history.  Social History   Tobacco Use  . Smoking status: Never Smoker  . Smokeless tobacco: Never Used  Substance Use Topics  . Alcohol use: No  . Drug use: No    Prior to Admission medications   Medication Sig Start Date End Date Taking? Authorizing Provider  amLODipine (NORVASC) 5 MG tablet Take 5 mg by mouth daily. 06/14/17  Yes [provider]  atorvastatin (LIPITOR) 10 MG tablet Take 10 mg by mouth at bedtime. 06/28/17  Yes [provider]  latanoprost (XALATAN) 0.005 % ophthalmic solution Place 1 drop into both eyes at bedtime. 06/29/20  Yes [provider]  bifidobacterium infantis (ALIGN) capsule Take 1 capsule by mouth daily.    [provider]  esomeprazole (NEXIUM) 20 MG capsule Take 20 mg by mouth daily.    [provider]  meloxicam (MOBIC) 15 MG tablet Take 15 mg by mouth daily as needed (for lower back or hip pain).  06/12/17   [provider]  Multiple Vitamin (MULTIVITAMIN) capsule Take 1 capsule by mouth daily.    [provider]  vitamin B-12 (CYANOCOBALAMIN) 500 MCG tablet Take 500 mcg by mouth daily.    [provider]    Current Facility-Administered  Medications  Medication Dose Route Frequency Provider Last Rate Last Admin  . 0.9 %  sodium chloride infusion (Manually program via Guardrails IV Fluids)   Intravenous Once Athena Masse, MD      . 0.9 %  sodium chloride infusion  10 mL/hr Intravenous Once Carrie Mew, MD   Held at 07/25/20 0231  . acetaminophen (TYLENOL) tablet 650 mg  650 mg Oral Q6H PRN Athena Masse, MD       Or  . acetaminophen (TYLENOL) suppository 650 mg  650 mg Rectal  Q6H PRN Athena Masse, MD      . ondansetron [facility]) tablet 4 mg  4 mg Oral Q6H PRN Athena Masse, MD       Or  . ondansetron [facility]) injection 4 mg  4 mg Intravenous Q6H PRN Athena Masse, MD      . pantoprazole (PROTONIX) injection 40 mg  40 mg Intravenous Q12H Athena Masse, MD   40 mg at 07/25/20 0925  . potassium chloride SA (KLOR-CON) CR tablet 40 mEq  40 mEq Oral BID Kayleen Memos, DO   40 mEq at 07/25/20 2774   Current Outpatient Medications  Medication Sig Dispense Refill  . amLODipine (NORVASC) 5 MG tablet Take 5 mg by mouth daily.  3  . atorvastatin (LIPITOR) 10 MG tablet Take 10 mg by mouth at bedtime.  3  . latanoprost (XALATAN) 0.005 % ophthalmic solution Place 1 drop into both eyes at bedtime.    . bifidobacterium infantis (ALIGN) capsule Take 1 capsule by mouth daily.    Marland Kitchen esomeprazole (NEXIUM) 20 MG capsule Take 20 mg by mouth daily.    . meloxicam (MOBIC) 15 MG tablet Take 15 mg by mouth daily as needed (for lower back or hip pain).   1  . Multiple Vitamin (MULTIVITAMIN) capsule Take 1 capsule by mouth daily.    . vitamin B-12 (CYANOCOBALAMIN) 500 MCG tablet Take 500 mcg by mouth daily.      Allergies as of 07/25/2020 - Review Complete 07/25/2020  Allergen Reaction Noted  . Claritin [loratadine] Hives 08/07/2017     Review of Systems:    All systems reviewed and negative except where noted in HPI, with the exception of hearing loss, intermittent lower extremity edema.       Physical Exam:  Vital signs in last 24 hours: Temp:  [92.5 F (33.6 C)-100.4 F (38 C)] 99.8 F (37.7 C) (12/13 1200) Pulse Rate:  [25-120] 90 (12/13 1200) Resp:  [15-41] 24 (12/13 1200) BP: (116-181)/(46-96) 134/78 (12/13 1200) SpO2:  [84 %-100 %] 98 % (12/13 1200) Weight:  [68 kg] 68 kg (12/13 0111)   General:   Pleasant elderly man in NAD Head:  Normocephalic and atraumatic. Eyes:   No icterus.   Conjunctiva pink. Ears:  Normal auditory acuity. Mouth: Mucosa pink  moist, no lesions. Neck:  Supple; no masses felt Lungs:  Respirations even and unlabored. Lungs clear to auscultation bilaterally.   No wheezes, crackles, or rhonchi.  Heart:  S1S2, RRR, no MRG. No edema. Abdomen:   Flat, soft, nondistended, nontender. Normal bowel sounds. No appreciable masses or hepatomegaly. No rebound signs or other peritoneal signs. Rectal:  Decreased rectal tone. No internal or external massess. Stool marroonish brown. Heme +. nontender. Msk:  MAEW x4, No clubbing or cyanosis. Strength 5/5. Symmetrical without gross deformities. Neurologic:  Alert and  oriented x4;  Cranial nerves II-XII intact.  Skin:  Warm, dry, pink without significant lesions or  rashes. Psych:  Alert and cooperative. Normal affect.  LAB RESULTS: Recent Labs    07/25/20 0122 07/25/20 0503  WBC 16.8* 3.8*  HGB 9.5* 13.2  HCT 28.4* 38.8*  PLT 218 169   BMET Recent Labs    07/25/20 0122 07/25/20 0503  NA 136 140  K 3.2* 3.3*  CL 103 106  CO2 21* 21*  GLUCOSE 221* 169*  BUN 50* 41*  CREATININE 1.98* 1.74*  CALCIUM 8.6* 8.2*   LFT Recent Labs    07/25/20 0503  PROT 5.8*  ALBUMIN 3.4*  AST 26  ALT 15  ALKPHOS 47  BILITOT 1.5*   PT/INR Recent Labs    07/25/20 0122  LABPROT 13.2  INR 1.0    STUDIES: CT HEAD WO CONTRAST  Result Date: 07/25/2020 CLINICAL DATA:  Head trauma, emesis, incontinence EXAM: CT HEAD WITHOUT CONTRAST TECHNIQUE: Contiguous axial images were obtained from the base of the skull through the vertex without intravenous contrast. COMPARISON:  None. FINDINGS: Brain: Normal anatomic configuration. Parenchymal volume loss is commensurate with the patient's age. Mild periventricular white matter changes are present likely reflecting the sequela of small vessel ischemia. No abnormal intra or extra-axial mass lesion or fluid collection. No abnormal mass effect or midline shift. No evidence of acute intracranial hemorrhage or infarct. Ventricular size is normal.  Cerebellum unremarkable. Vascular: No asymmetric hyperdense vasculature at the skull base. Skull: Intact Sinuses/Orbits: Paranasal sinuses are clear. Orbits are unremarkable. Other: Mastoid air cells and middle ear cavities are clear. IMPRESSION: Mild senescent changes. No acute intracranial abnormality. No calvarial fracture. Electronically Signed   By: Fidela Salisbury MD   On: 07/25/2020 01:45   DG Chest Portable 1 View  Result Date: 07/25/2020 CLINICAL DATA:  Vomiting EXAM: PORTABLE CHEST 1 VIEW COMPARISON:  08/07/2017 FINDINGS: The heart size and mediastinal contours are within normal limits. Both lungs are clear. The visualized skeletal structures are unremarkable. IMPRESSION: No active disease. Electronically Signed   By: Fidela Salisbury MD   On: 07/25/2020 01:51   CT Angio Abd/Pel W and/or Wo Contrast  Result Date: 07/25/2020 CLINICAL DATA:  GI bleed EXAM: CTA ABDOMEN AND PELVIS WITHOUT AND WITH CONTRAST TECHNIQUE: Multidetector CT imaging of the abdomen and pelvis was performed using the standard protocol during bolus administration of intravenous contrast. Multiplanar reconstructed images and MIPs were obtained and reviewed to evaluate the vascular anatomy. CONTRAST:  74mL OMNIPAQUE IOHEXOL 350 MG/ML SOLN COMPARISON:  08/07/2017 FINDINGS: VASCULAR Aorta: Heavily calcified aorta.  No aneurysm. Celiac: Patent SMA: Patent Renals: 1 right renal artery and 2 left renal arteries, patent. IMA: Patent Inflow: Calcified common iliac arteries.  Patent. Proximal Outflow: Calcifications, patent. Veins: No obvious venous abnormality within the limitations of this arterial phase study. Review of the MIP images confirms the above findings. NON-VASCULAR Lower chest: No acute abnormality. Hepatobiliary: Scattered hypodensities in the liver compatible with cysts. Prior cholecystectomy. Pancreas: No focal abnormality or ductal dilatation. Spleen: No focal abnormality.  Normal size. Adrenals/Urinary Tract: Bilateral  renal cysts. No hydronephrosis. Adrenal glands unremarkable. Urinary bladder decompressed with Foley catheter in place. Stomach/Bowel: Extensive sigmoid diverticulosis. No active diverticulitis. No visible active contrast extravasation to localize GI bleed. Stomach and small bowel decompressed, unremarkable. Lymphatic: No adenopathy Reproductive: Prostate enlargement Other: No free fluid or free air. Musculoskeletal: No acute bony abnormality. Degenerative changes in the lumbar spine. IMPRESSION: VASCULAR No focal contrast extravasation seen to localize GI bleed. Extensive aortic and iliac calcifications. NON-VASCULAR Extensive sigmoid diverticulosis. Electronically Signed   By: Lennette Bihari  Dover M.D.   On: 07/25/2020 03:10       Impression / Plan:    1. NVD,episode of lower abdominal pain,  rectal bleeding- note elevated wbc on admission without obvious urinary source.  DDX includes infectious agent, diverticulitis, other. No obvious bleeding or malignancy on imaging. He should avoid nsaids given kidney and GI considerations.  Consideration of abx may be appropriate, will discuss further with Dr Haig Prophet. Thank you very much for this consult. These services were provided by Stephens November, NP-C, in collaboration with Lollie Sails, MD, with whom I have discussed this patient in full.    Addendum: after further discussion and review with Dr Haig Prophet, given nsaids and bleeding will arrange EGD and colonoscopy for tomorrow- this was discussed with patient and daughter, and they are agreaable. Stephens November, NP-C

## 2020-07-25 NOTE — ED Notes (Signed)
Patient instructed to keep arm straight to enable IV Tylenol to infuse. Pt verbalized understanding.

## 2020-07-25 NOTE — ED Notes (Signed)
MD Damita Dunnings at bedside discussing plan of care with patient and family.

## 2020-07-25 NOTE — Progress Notes (Signed)
Xavier Foster is a 86 y.o. male with medical history significant for HTN, [name], gout and arthritis on meloxicam, as well as constipation and bleeding hemorrhoids brought in by EMS following a fall preceded by dizziness as he attempted to get up off the commode.Marland Kitchen He his head but did not lose consciousness. Patient has been passing bright red blood per rectum intermittently for the past 3 days which she initially thought was related to straining to have a bowel movement however the episodes became more frequent and heavier and with clots.  He denies black tarry stool.Denies abdominal pain. He denies chest pain or shortness of breath or palpitations but feels very weak and fatigued.  Daughter at bedside contributes to history.  While being transported by EMS he had a single emesis of clear gastric contents.  ED Course: On arrival, he was afebrile, mildly hypothermic at 94.7, heart rate 70 with BP 166/65 O2 sat 96% on room air. During his ED course he became more tachycardic up to 140 but this improved with IV fluid bolus. He also reportedly had a bradycardic event while in the ER. Blood work significant for hemoglobin of 9.5, down from his baseline of 13.6 in July on Care Everywhere. Creatinine 1.98 above his baseline of 1.2 in July 2021. BUN 50, glucose 221, potassium 3.2. EKG as reviewed by me : Normal sinus rhythm at 52 with no acute ST-T wave changes Imaging: Chest x-ray clear, head CT with no acute trauma. CT angio abdomen and pelvis showed no focal contrast extravasation seen to localize GI bleed. Extensive sigmoid diverticulosis Patient was treated with an IV fluid bolus and started on transfusion of one of 2 units. Hospitalist consulted for admission.  07/25/20: Seen and examined with his daughter at his bedside in the ED.  Reports feeling nauseous.  No abdominal pain.  Post 2 units PRBCs transfusions.  Repeat hemoglobin this morning 13.2K.  N.p.o. until seen by GI.  UA with urine culture  ordered due to fever with T-max of 100.4 and reportedly vomited overnight.  Follow results.  Please refer to H&P dictated by my partner Dr. Damita Dunnings on 07/25/2020 for further details of the assessment and plan.Marland Kitchen

## 2020-07-25 NOTE — ED Notes (Signed)
Pt checked for stool in brief. No stool at this time. Pt denies further needs at this time.

## 2020-07-25 NOTE — ED Notes (Signed)
Pharmacy contacted requesting verification and tubing of IV tylenol for RN to administer.

## 2020-07-25 NOTE — ED Notes (Signed)
Patient positioned in stretcher for comfort. Reverse trendelenburg in use. Knees elevated and HOB elevated ~60 degrees. Cardiac monitor in place and bear hugger re-applied with additional blankets on top. Patient skin noted to be less pale and with pink tint. Patient is additionally more alert and interactive, breathing less labored than on arrival. RN to continue to monitor.

## 2020-07-25 NOTE — ED Provider Notes (Signed)
[facility] Emergency Department Provider Note  ____________________________________________  Time seen: Approximately 1:10 AM  I have reviewed the triage vital signs and the nursing notes.   HISTORY  Chief Complaint GI Bleeding    HPI Islam Eichinger is a 86 y.o. male with a history of hypertension and hyperlipidemia who was brought to the ED due to rectal bleeding for the past 3 days.  No blood thinner use or liver disease.  Denies pain.  He has had multiple episodes of passing frank blood.  Today, after trying to get up from the toilet he got dizzy and fell, hitting the right side of his head.      Past Medical History:  Diagnosis Date  . High cholesterol   . Hypertension      Patient Active Problem List   Diagnosis Date Noted  . Cholecystitis 08/07/2017     Past Surgical History:  Procedure Laterality Date  . CHOLECYSTECTOMY N/A 08/08/2017   Procedure: LAPAROSCOPIC CHOLECYSTECTOMY;  Surgeon: Ileana Roup, MD;  Location: [facility];  Service: General;  Laterality: N/A;     Prior to Admission medications   Medication Sig Start Date End Date Taking? Authorizing Provider  amLODipine (NORVASC) 5 MG tablet Take 5 mg by mouth daily. 06/14/17   [provider]  atorvastatin (LIPITOR) 10 MG tablet Take 10 mg by mouth at bedtime. 06/28/17   [provider]  HYDROcodone-acetaminophen (NORCO/VICODIN) 5-325 MG tablet Take 1 tablet by mouth See admin instructions. 1 tablet every four to six hours as needed for pain 08/07/17   [provider]  meloxicam (MOBIC) 15 MG tablet Take 15 mg by mouth daily as needed (for lower back or hip pain).  06/12/17   [provider]  promethazine (PHENERGAN) 25 MG tablet Take 25 mg by mouth every 8 (eight) hours as needed for nausea or vomiting.  08/07/17   [provider]     Allergies Claritin [loratadine]   History reviewed. No pertinent family  history.  Social History Social History   Tobacco Use  . Smoking status: Never Smoker  . Smokeless tobacco: Never Used  Substance Use Topics  . Alcohol use: No  . Drug use: No    Review of Systems  Constitutional:   No fever or chills.  ENT:   No sore throat. No rhinorrhea. Cardiovascular:   No chest pain or syncope. Respiratory:   No dyspnea or cough. Gastrointestinal:   Negative for abdominal pain, vomiting and diarrhea.  Positive rectal bleeding Musculoskeletal:   Negative for focal pain or swelling All other systems reviewed and are negative except as documented above in ROS and HPI.  ____________________________________________   PHYSICAL EXAM:  VITAL SIGNS: ED Triage Vitals  Enc Vitals Group     BP      Pulse      Resp      Temp      Temp src      SpO2      Weight      Height      Head Circumference      Peak Flow      Pain Score      Pain Loc      Pain Edu?      Excl. in [place]?     Vital signs reviewed, nursing assessments reviewed.   Constitutional:   Alert and oriented.  Ill-appearing. Eyes:   Conjunctivae are pale. EOMI. PERRL. ENT      Head:  Normocephalic and atraumatic.      Nose:   Normal.      Mouth/Throat:   Normal.      Neck:   No meningismus. Full ROM. Hematological/Lymphatic/Immunilogical:   No cervical lymphadenopathy. Cardiovascular:   RRR. Symmetric bilateral radial and DP pulses.  No murmurs. Cap refill less than 2 seconds. Respiratory:   Normal respiratory effort without tachypnea/retractions. Breath sounds are clear and equal bilaterally. No wheezes/rales/rhonchi. Gastrointestinal:   Soft and nontender. Non distended. There is no CVA tenderness.  No rebound, rigidity, or guarding.  Frank blood and melanotic stool.  Patient is vomiting on arrival, emesis is clear and nonbloody Genitourinary:   Normal Musculoskeletal:   Normal range of motion in all extremities. No joint effusions.  No lower extremity tenderness.  No  edema. Neurologic:   Normal speech and language.  Motor grossly intact. No acute focal neurologic deficits are appreciated.  Skin:    Skin is warm, dry and intact. No rash noted.  No petechiae, purpura, or bullae.  ____________________________________________    LABS (pertinent positives/negatives) (all labs ordered are listed, but only abnormal results are displayed) Labs Reviewed  COMPREHENSIVE METABOLIC PANEL - Abnormal; Notable for the following components:      Result Value   Potassium 3.2 (*)    CO2 21 (*)    Glucose, Bld 221 (*)    BUN 50 (*)    Creatinine, Ser 1.98 (*)    Calcium 8.6 (*)    Total Protein 6.3 (*)    GFR, Estimated 32 (*)    All other components within normal limits  CBC WITH DIFFERENTIAL/PLATELET - Abnormal; Notable for the following components:   WBC 16.8 (*)    RBC 3.20 (*)    Hemoglobin 9.5 (*)    HCT 28.4 (*)    Neutro Abs 12.4 (*)    Monocytes Absolute 1.2 (*)    Abs Immature Granulocytes 0.14 (*)    All other components within normal limits  RESP PANEL BY RT-PCR (FLU A&B, COVID) ARPGX2  PROTIME-INR  MAGNESIUM  PHOSPHORUS  TYPE AND SCREEN  PREPARE RBC (CROSSMATCH)  ABO/RH   ____________________________________________   EKG  Interpreted by me Sinus bradycardia rate of 52, normal axis and intervals.  Poor R wave progression.  Normal ST segments and T waves.  ____________________________________________    RADIOLOGY  CT HEAD WO CONTRAST  Result Date: 07/25/2020 CLINICAL DATA:  Head trauma, emesis, incontinence EXAM: CT HEAD WITHOUT CONTRAST TECHNIQUE: Contiguous axial images were obtained from the base of the skull through the vertex without intravenous contrast. COMPARISON:  None. FINDINGS: Brain: Normal anatomic configuration. Parenchymal volume loss is commensurate with the patient's age. Mild periventricular white matter changes are present likely reflecting the sequela of small vessel ischemia. No abnormal intra or extra-axial  mass lesion or fluid collection. No abnormal mass effect or midline shift. No evidence of acute intracranial hemorrhage or infarct. Ventricular size is normal. Cerebellum unremarkable. Vascular: No asymmetric hyperdense vasculature at the skull base. Skull: Intact Sinuses/Orbits: Paranasal sinuses are clear. Orbits are unremarkable. Other: Mastoid air cells and middle ear cavities are clear. IMPRESSION: Mild senescent changes. No acute intracranial abnormality. No calvarial fracture. Electronically Signed   By: Fidela Salisbury MD   On: 07/25/2020 01:45   DG Chest Portable 1 View  Result Date: 07/25/2020 CLINICAL DATA:  Vomiting EXAM: PORTABLE CHEST 1 VIEW COMPARISON:  08/07/2017 FINDINGS: The heart size and mediastinal contours are within normal limits. Both lungs are clear. The visualized skeletal structures  are unremarkable. IMPRESSION: No active disease. Electronically Signed   By: Fidela Salisbury MD   On: 07/25/2020 01:51   CT Angio Abd/Pel W and/or Wo Contrast  Result Date: 07/25/2020 CLINICAL DATA:  GI bleed EXAM: CTA ABDOMEN AND PELVIS WITHOUT AND WITH CONTRAST TECHNIQUE: Multidetector CT imaging of the abdomen and pelvis was performed using the standard protocol during bolus administration of intravenous contrast. Multiplanar reconstructed images and MIPs were obtained and reviewed to evaluate the vascular anatomy. CONTRAST:  62mL OMNIPAQUE IOHEXOL 350 MG/ML SOLN COMPARISON:  08/07/2017 FINDINGS: VASCULAR Aorta: Heavily calcified aorta.  No aneurysm. Celiac: Patent SMA: Patent Renals: 1 right renal artery and 2 left renal arteries, patent. IMA: Patent Inflow: Calcified common iliac arteries.  Patent. Proximal Outflow: Calcifications, patent. Veins: No obvious venous abnormality within the limitations of this arterial phase study. Review of the MIP images confirms the above findings. NON-VASCULAR Lower chest: No acute abnormality. Hepatobiliary: Scattered hypodensities in the liver compatible with  cysts. Prior cholecystectomy. Pancreas: No focal abnormality or ductal dilatation. Spleen: No focal abnormality.  Normal size. Adrenals/Urinary Tract: Bilateral renal cysts. No hydronephrosis. Adrenal glands unremarkable. Urinary bladder decompressed with Foley catheter in place. Stomach/Bowel: Extensive sigmoid diverticulosis. No active diverticulitis. No visible active contrast extravasation to localize GI bleed. Stomach and small bowel decompressed, unremarkable. Lymphatic: No adenopathy Reproductive: Prostate enlargement Other: No free fluid or free air. Musculoskeletal: No acute bony abnormality. Degenerative changes in the lumbar spine. IMPRESSION: VASCULAR No focal contrast extravasation seen to localize GI bleed. Extensive aortic and iliac calcifications. NON-VASCULAR Extensive sigmoid diverticulosis. Electronically Signed   By: Rolm Baptise M.D.   On: 07/25/2020 03:10    ____________________________________________   PROCEDURES .Critical Care Performed by: Carrie Mew, MD Authorized by: Carrie Mew, MD   Critical care provider statement:    Critical care time (minutes):  35   Critical care time was exclusive of:  Separately billable procedures and treating other patients   Critical care was necessary to treat or prevent imminent or life-threatening deterioration of the following conditions:  Circulatory failure   Critical care was time spent personally by me on the following activities:  Development of treatment plan with patient or surrogate, discussions with consultants, evaluation of patient's response to treatment, examination of patient, obtaining history from patient or surrogate, ordering and performing treatments and interventions, ordering and review of laboratory studies, ordering and review of radiographic studies, pulse oximetry, re-evaluation of patient's condition and review of old charts    ____________________________________________    CLINICAL IMPRESSION  / ASSESSMENT AND PLAN / ED COURSE  Medications ordered in the ED: Medications  0.9 %  sodium chloride infusion (0 mL/hr Intravenous Hold 07/25/20 0231)  lactated ringers bolus 1,000 mL (0 mLs Intravenous Stopped 07/25/20 0142)  pantoprazole (PROTONIX) injection 40 mg (40 mg Intravenous Given 07/25/20 0126)  ondansetron (ZOFRAN) injection 4 mg (4 mg Intravenous Given 07/25/20 0127)  sodium chloride 0.9 % bolus 1,000 mL (1,000 mLs Intravenous New Bag/Given 07/25/20 0157)  metoCLOPramide (REGLAN) injection 10 mg (10 mg Intravenous Given 07/25/20 0208)  iohexol (OMNIPAQUE) 350 MG/ML injection 75 mL (75 mLs Intravenous Contrast Given 07/25/20 0243)  haloperidol lactate (HALDOL) injection 2.5 mg (2.5 mg Intravenous Given 07/25/20 4782)    Pertinent labs & imaging results that were available during my care of the patient were reviewed by me and considered in my medical decision making (see chart for details).  Heru Montz was evaluated in Emergency Department on 07/25/2020 for the symptoms described in the  history of present illness. He was evaluated in the context of the global COVID-19 pandemic, which necessitated consideration that the patient might be at risk for infection with the SARS-CoV-2 virus that causes COVID-19. Institutional protocols and algorithms that pertain to the evaluation of patients at risk for COVID-19 are in a state of rapid change based on information released by regulatory bodies including the CDC and federal and state organizations. These policies and algorithms were followed during the patient's care in the ED.   Patient presents with GI bleed.  Clear emesis makes gastric source unlikely, but presence of melena suggesting higher source and GI tract.  Will give Protonix, IV fluid bolus while checking labs.  Initial vitals by EMS were stable, will monitor closely in the ED and trend vitals.  Patient provides verbal consent for emergency blood transfusion if  needed.  Clinical Course as of 07/25/20 0414  Mon Jul 25, 2020  0157 Patient had an episode of bradycardia while in CT, blood pressure remained stable, but I decided to start emergency blood transfusion due to suspected ongoing GI hemorrhage.  Additional history obtained from daughter at bedside who notes that he started meloxicam about 2 months ago and has been taking this daily. [PS]  0413 Repeat EKG shows no significant change.  Normal intervals. [PS]    Clinical Course User Index [PS] Carrie Mew, MD    ----------------------------------------- 3:25 AM on 07/25/2020 -----------------------------------------  CT head unremarkable, no signs of bleeding.  Images were reviewed by me as well.  CT angiogram of the abdomen shows extensive diverticulosis, no localizable active bleeding.  No tumor.  Body temperature is improving.  Hemodynamics remain stable.  I doubt infection, and I do not think the patient is septic.  I think the hypothermia is due to blood loss and hemorrhagic shock.   ____________________________________________   FINAL CLINICAL IMPRESSION(S) / ED DIAGNOSES    Final diagnoses:  Acute GI bleeding  Hemorrhagic shock [facility])     ED Discharge Orders    None      Portions of this note were generated with dragon dictation software. Dictation errors may occur despite best attempts at proofreading.   Carrie Mew, MD 07/25/20 [phone]

## 2020-07-25 NOTE — ED Notes (Signed)
Patient transported from CT with RN on stretcher. Full cardiac monitor in place.

## 2020-07-26 ENCOUNTER — Inpatient Hospital Stay: Payer: Medicare Other | Admitting: Anesthesiology

## 2020-07-26 ENCOUNTER — Encounter
Admission: EM | Disposition: A | Discharge status: Home / Self Care | Payer: Self-pay | Source: Home / Self Care | Attending: Internal Medicine

## 2020-07-26 ENCOUNTER — Encounter: Payer: Self-pay | Admitting: Internal Medicine

## 2020-07-26 HISTORY — PX: COLONOSCOPY: SHX5424

## 2020-07-26 HISTORY — PX: ESOPHAGOGASTRODUODENOSCOPY: SHX5428

## 2020-07-26 LAB — URINE CULTURE: Culture: NO GROWTH

## 2020-07-26 LAB — CBC WITH DIFFERENTIAL/PLATELET
Abs Immature Granulocytes: 0.07 10*3/uL (ref 0.00–0.07)
Basophils Absolute: 0 10*3/uL (ref 0.0–0.1)
Basophils Relative: 0 %
Eosinophils Absolute: 0 10*3/uL (ref 0.0–0.5)
Eosinophils Relative: 0 %
HCT: 33.8 % — ABNORMAL LOW (ref 39.0–52.0)
Hemoglobin: 11.8 g/dL — ABNORMAL LOW (ref 13.0–17.0)
Immature Granulocytes: 1 %
Lymphocytes Relative: 7 %
Lymphs Abs: 1 10*3/uL (ref 0.7–4.0)
MCH: 30.1 pg (ref 26.0–34.0)
MCHC: 34.9 g/dL (ref 30.0–36.0)
MCV: 86.2 fL (ref 80.0–100.0)
Monocytes Absolute: 1.3 10*3/uL — ABNORMAL HIGH (ref 0.1–1.0)
Monocytes Relative: 9 %
Neutro Abs: 11.5 10*3/uL — ABNORMAL HIGH (ref 1.7–7.7)
Neutrophils Relative %: 83 %
Platelets: 150 10*3/uL (ref 150–400)
RBC: 3.92 MIL/uL — ABNORMAL LOW (ref 4.22–5.81)
RDW: 15.4 % (ref 11.5–15.5)
Smear Review: NORMAL
WBC: 13.9 10*3/uL — ABNORMAL HIGH (ref 4.0–10.5)
nRBC: 0 % (ref 0.0–0.2)

## 2020-07-26 LAB — TYPE AND SCREEN
ABO/RH(D): O NEG
Antibody Screen: NEGATIVE
Unit division: 0
Unit division: 0

## 2020-07-26 LAB — BPAM RBC
Blood Product Expiration Date: 202201012359
Blood Product Expiration Date: 202201012359
ISSUE DATE / TIME: 202112130142
ISSUE DATE / TIME: 202112130142
Unit Type and Rh: 5100
Unit Type and Rh: 5100

## 2020-07-26 LAB — COMPREHENSIVE METABOLIC PANEL
ALT: 15 U/L (ref 0–44)
AST: 30 U/L (ref 15–41)
Albumin: 3.1 g/dL — ABNORMAL LOW (ref 3.5–5.0)
Alkaline Phosphatase: 37 U/L — ABNORMAL LOW (ref 38–126)
Anion gap: 9 (ref 5–15)
BUN: 33 mg/dL — ABNORMAL HIGH (ref 8–23)
CO2: 21 mmol/L — ABNORMAL LOW (ref 22–32)
Calcium: 8.6 mg/dL — ABNORMAL LOW (ref 8.9–10.3)
Chloride: 107 mmol/L (ref 98–111)
Creatinine, Ser: 1.46 mg/dL — ABNORMAL HIGH (ref 0.61–1.24)
GFR, Estimated: 46 mL/min — ABNORMAL LOW (ref >60)
Glucose, Bld: 117 mg/dL — ABNORMAL HIGH (ref 70–99)
Potassium: 4.2 mmol/L (ref 3.5–5.1)
Sodium: 137 mmol/L (ref 135–145)
Total Bilirubin: 1.3 mg/dL — ABNORMAL HIGH (ref 0.3–1.2)
Total Protein: 5.9 g/dL — ABNORMAL LOW (ref 6.5–8.1)

## 2020-07-26 LAB — MAGNESIUM: Magnesium: 1.7 mg/dL (ref 1.7–2.4)

## 2020-07-26 SURGERY — EGD (ESOPHAGOGASTRODUODENOSCOPY)
Anesthesia: General

## 2020-07-26 MED ORDER — AMLODIPINE BESYLATE 5 MG PO TABS
5.0000 mg | ORAL_TABLET | Freq: Every day | ORAL | Status: DC
  Administered 2020-07-26 – 2020-07-27 (×2): 5 mg via ORAL
  Filled 2020-07-26 (×2): qty 1

## 2020-07-26 MED ORDER — SODIUM CHLORIDE 0.9 % IV SOLN
INTRAVENOUS | Status: DC
  Administered 2020-07-26: 15:00:00 1000 mL via INTRAVENOUS

## 2020-07-26 MED ORDER — PROPOFOL 500 MG/50ML IV EMUL
INTRAVENOUS | Status: DC | PRN
  Administered 2020-07-26: 150 ug/kg/min via INTRAVENOUS

## 2020-07-26 MED ORDER — PROPOFOL 10 MG/ML IV BOLUS
INTRAVENOUS | Status: DC | PRN
  Administered 2020-07-26: 50 mg via INTRAVENOUS

## 2020-07-26 MED ORDER — PHENYLEPHRINE HCL (PRESSORS) 10 MG/ML IV SOLN
INTRAVENOUS | Status: DC | PRN
  Administered 2020-07-26 (×3): 100 ug via INTRAVENOUS
  Administered 2020-07-26: 200 ug via INTRAVENOUS

## 2020-07-26 MED ORDER — LIDOCAINE HCL (CARDIAC) PF 100 MG/5ML IV SOSY
PREFILLED_SYRINGE | INTRAVENOUS | Status: DC | PRN
  Administered 2020-07-26: 50 mg via INTRAVENOUS

## 2020-07-26 MED ORDER — CHLORHEXIDINE GLUCONATE CLOTH 2 % EX PADS
6.0000 | MEDICATED_PAD | Freq: Every day | CUTANEOUS | Status: DC
  Administered 2020-07-26 – 2020-07-27 (×2): 6 via TOPICAL

## 2020-07-26 MED ORDER — ATORVASTATIN CALCIUM 10 MG PO TABS
10.0000 mg | ORAL_TABLET | Freq: Every day | ORAL | Status: DC
  Administered 2020-07-26: 21:00:00 10 mg via ORAL
  Filled 2020-07-26: qty 1

## 2020-07-26 MED ORDER — LABETALOL HCL 5 MG/ML IV SOLN
10.0000 mg | INTRAVENOUS | Status: DC | PRN
  Administered 2020-07-26: 06:00:00 10 mg via INTRAVENOUS
  Filled 2020-07-26: qty 4

## 2020-07-26 MED ORDER — VITAMIN B-12 1000 MCG PO TABS
500.0000 ug | ORAL_TABLET | Freq: Every day | ORAL | Status: DC
  Administered 2020-07-26 – 2020-07-27 (×2): 500 ug via ORAL
  Filled 2020-07-26 (×2): qty 1

## 2020-07-26 NOTE — Op Note (Signed)
[facility] Gastroenterology Patient Name: Xavier Foster Procedure Date: 07/26/2020 3:17 PM MRN: [id number] Account #: [id number] Date of Birth: Jun 22, 1929 Admit Type: Outpatient Age: 86 Room: [facility] ENDO ROOM 1 Gender: Male Note Status: Finalized Procedure:             Colonoscopy Indications:           Rectal bleeding Providers:             Andrey Farmer MD, MD Medicines:             Monitored Anesthesia Care Complications:         No immediate complications. Estimated blood loss:                         Minimal. Procedure:             Pre-Anesthesia Assessment:                        - Prior to the procedure, a History and Physical was                         performed, and patient medications and allergies were                         reviewed. The patient is competent. The risks and                         benefits of the procedure and the sedation options and                         risks were discussed with the patient. All questions                         were answered and informed consent was obtained.                         Patient identification and proposed procedure were                         verified by the physician, the nurse, the anesthetist                         and the technician in the endoscopy suite. Mental                         Status Examination: alert and oriented. Airway                         Examination: normal oropharyngeal airway and neck                         mobility. Respiratory Examination: clear to                         auscultation. CV Examination: normal. Prophylactic                         Antibiotics: The patient does not require prophylactic  antibiotics. Prior Anticoagulants: The patient has                         taken no previous anticoagulant or antiplatelet                         agents. ASA Grade Assessment: III - A patient with                         severe systemic disease.  After reviewing the risks and                         benefits, the patient was deemed in satisfactory                         condition to undergo the procedure. The anesthesia                         plan was to use monitored anesthesia care (MAC).                         Immediately prior to administration of medications,                         the patient was re-assessed for adequacy to receive                         sedatives. The heart rate, respiratory rate, oxygen                         saturations, blood pressure, adequacy of pulmonary                         ventilation, and response to care were monitored                         throughout the procedure. The physical status of the                         patient was re-assessed after the procedure.                        After obtaining informed consent, the colonoscope was                         passed under direct vision. Throughout the procedure,                         the patient's blood pressure, pulse, and oxygen                         saturations were monitored continuously. The                         Colonoscope was introduced through the anus with the                         intention of advancing to the cecum. The scope was  advanced to the splenic flexure before the procedure                         was aborted. Medications were given. The colonoscopy                         was technically difficult and complex due to                         inadequate bowel prep. The patient tolerated the                         procedure well. The quality of the bowel preparation                         was poor. The colonoscopy was aborted due to poor                         bowel prep with stool present. Findings:      The perianal and digital rectal examinations were normal.      Three sessile polyps were found in the sigmoid colon. The polyps were 2       to 3 mm in size. These polyps were removed  with a cold snare. Resection       and retrieval were complete. Estimated blood loss was minimal.      Many small and large-mouthed diverticula were found in the sigmoid colon.      Non-bleeding internal hemorrhoids were found during retroflexion. The       hemorrhoids were Grade II (internal hemorrhoids that prolapse but reduce       spontaneously).      The exam was otherwise without abnormality on direct and retroflexion       views. Impression:            - Preparation of the colon was poor.                        - The procedure was aborted due to poor bowel prep                         with stool present.                        - Three 2 to 3 mm polyps in the sigmoid colon, removed                         with a cold snare. Resected and retrieved.                        - Diverticulosis in the sigmoid colon.                        - Non-bleeding internal hemorrhoids.                        - The examination was otherwise normal on direct and                         retroflexion views. Recommendation:        -  Return patient to hospital ward for ongoing care.                        - Advance diet as tolerated.                        - Continue present medications. Recommend treating any                         constipation.                        - Await pathology results.                        - Repeat colonoscopy as an outpatient if potential                         benefits would outweigh the risks given age. Procedure Code(s):     --- Professional ---                        [phone], 52, Colonoscopy, flexible; with removal of                         tumor(s), polyp(s), or other lesion(s) by snare                         technique Diagnosis Code(s):     --- Professional ---                        K64.1, Second degree hemorrhoids                        K63.5, Polyp of colon                        K62.5, Hemorrhage of anus and rectum                        K57.30, Diverticulosis  of large intestine without                         perforation or abscess without bleeding CPT copyright 2019 American Medical Association. All rights reserved. The codes documented in this report are preliminary and upon coder review may  be revised to meet current compliance requirements. Andrey Farmer, MD Andrey Farmer MD, MD 07/26/2020 3:52:46 PM Number of Addenda: 0 Note Initiated On: 07/26/2020 3:17 PM Total Procedure Duration: 0 hours 7 minutes 15 seconds  Estimated Blood Loss:  Estimated blood loss was minimal.      [facility]

## 2020-07-26 NOTE — ED Notes (Signed)
Hospitalist NP, provider notified of pts blood pressure.

## 2020-07-26 NOTE — Anesthesia Postprocedure Evaluation (Signed)
Anesthesia Post Note  Patient: Xavier Foster  Procedure(s) Performed: ESOPHAGOGASTRODUODENOSCOPY (EGD) (N/A ) COLONOSCOPY (N/A )  Patient location during evaluation: Endoscopy Anesthesia Type: General Level of consciousness: awake and alert Pain management: pain level controlled Vital Signs Assessment: post-procedure vital signs reviewed and stable Respiratory status: spontaneous breathing, nonlabored ventilation, respiratory function stable and patient connected to nasal cannula oxygen Cardiovascular status: blood pressure returned to baseline and stable Postop Assessment: no apparent nausea or vomiting Anesthetic complications: no   No complications documented.   Last Vitals:  Vitals:   07/26/20 1620 07/26/20 1639  BP: (!) 151/77 (!) 156/89  Pulse: 92 91  Resp: (!) 27 18  Temp:  36.8 C  SpO2: 97% 97%    Last Pain:  Vitals:   07/26/20 1639  TempSrc: Oral  PainSc:                  Arita Miss

## 2020-07-26 NOTE — ED Notes (Signed)
Patient denies pain and is resting comfortably.  

## 2020-07-26 NOTE — Care Plan (Signed)
EGD unremarkable. Colon with poor prep so aborted at splenic flexture. Severe diverticulosis in sigmoid. Three small polyps removed. Had very large internal hemorrhoids.  - advance diet - stool softeners for hemorrhoids - no further work-up from my end as an inpatient  Please call with any questions or concerns.  Raylene Miyamoto MD, MPH [place]

## 2020-07-26 NOTE — Anesthesia Preprocedure Evaluation (Signed)
Anesthesia Evaluation  Patient identified by MRN, date of birth, ID band Patient awake    Reviewed: Allergy & Precautions, H&P , NPO status , Patient's Chart, lab work & pertinent test results  History of Anesthesia Complications Negative for: history of anesthetic complications  Airway Mallampati: III  TM Distance: <3 FB Neck ROM: limited    Dental  (+) Poor Dentition, Missing   Pulmonary neg pulmonary ROS, neg shortness of breath,    Pulmonary exam normal        Cardiovascular Exercise Tolerance: Good hypertension, (-) anginaNormal cardiovascular exam     Neuro/Psych negative neurological ROS  negative psych ROS   GI/Hepatic negative GI ROS, Neg liver ROS, neg GERD  ,  Endo/Other  negative endocrine ROS  Renal/GU ARFRenal disease  negative genitourinary   Musculoskeletal  (+) Arthritis ,   Abdominal   Peds  Hematology negative hematology ROS (+)   Anesthesia Other Findings Patient is NPO appropriate and reports no nausea or vomiting today.  Past Medical History: No date: High cholesterol No date: Hypertension  Past Surgical History: 08/08/2017: CHOLECYSTECTOMY; N/A     Comment:  Procedure: LAPAROSCOPIC CHOLECYSTECTOMY;  Surgeon:               Ileana Roup, MD;  Location: [facility] OR;  Service:               General;  Laterality: N/A;  BMI    Body Mass Index: 22.30 kg/m      Reproductive/Obstetrics negative OB ROS                             Anesthesia Physical Anesthesia Plan  ASA: II  Anesthesia Plan: General   Post-op Pain Management:    Induction: Intravenous  PONV Risk Score and Plan: Propofol infusion and TIVA  Airway Management Planned: Natural Airway and Nasal Cannula  Additional Equipment:   Intra-op Plan:   Post-operative Plan:   Informed Consent: I have reviewed the patients History and Physical, chart, labs and discussed the procedure including  the risks, benefits and alternatives for the proposed anesthesia with the patient or authorized representative who has indicated his/her understanding and acceptance.     Dental Advisory Given  Plan Discussed with: Anesthesiologist, CRNA and Surgeon  Anesthesia Plan Comments: (Patient consented for risks of anesthesia including but not limited to:  - adverse reactions to medications - risk of airway placement if required - damage to eyes, teeth, lips or other oral mucosa - nerve damage due to positioning  - sore throat or hoarseness - Damage to heart, brain, nerves, lungs, other parts of body or loss of life  Patient voiced understanding.)        Anesthesia Quick Evaluation

## 2020-07-26 NOTE — Transfer of Care (Signed)
Immediate Anesthesia Transfer of Care Note  Patient: Xavier Foster  Procedure(s) Performed: ESOPHAGOGASTRODUODENOSCOPY (EGD) (N/A ) COLONOSCOPY (N/A )  Patient Location: Endoscopy Unit  Anesthesia Type:General  Level of Consciousness: drowsy  Airway & Oxygen Therapy: Patient Spontanous Breathing  Post-op Assessment: Report given to RN and Post -op Vital signs reviewed and stable  Post vital signs: Reviewed and stable  Last Vitals:  Vitals Value Taken Time  BP 109/50 07/26/20 1552  Temp 37.2 C 07/26/20 1549  Pulse 68 07/26/20 1553  Resp 19 07/26/20 1553  SpO2 95 % 07/26/20 1553  Vitals shown include unvalidated device data.  Last Pain:  Vitals:   07/26/20 1549  TempSrc:   PainSc: Asleep      Patients Stated Pain Goal: 0 (93/57/01 [id number])  Complications: No complications documented.

## 2020-07-26 NOTE — Op Note (Signed)
[facility] Gastroenterology Patient Name: Xavier Foster Procedure Date: 07/26/2020 3:17 PM MRN: [id number] Account #: [id number] Date of Birth: 1928/09/24 Admit Type: Outpatient Age: 86 Room: [facility] ENDO ROOM 1 Gender: Male Note Status: Finalized Procedure:             Upper GI endoscopy Indications:           Gastrointestinal bleeding of unknown origin Providers:             Andrey Farmer MD, MD Medicines:             Monitored Anesthesia Care Complications:         No immediate complications. Procedure:             Pre-Anesthesia Assessment:                        - Prior to the procedure, a History and Physical was                         performed, and patient medications and allergies were                         reviewed. The patient is competent. The risks and                         benefits of the procedure and the sedation options and                         risks were discussed with the patient. All questions                         were answered and informed consent was obtained.                         Patient identification and proposed procedure were                         verified by the physician, the nurse, the anesthetist                         and the technician in the endoscopy suite. Mental                         Status Examination: alert and oriented. Airway                         Examination: normal oropharyngeal airway and neck                         mobility. Respiratory Examination: clear to                         auscultation. CV Examination: normal. Prophylactic                         Antibiotics: The patient does not require prophylactic                         antibiotics. Prior Anticoagulants: The patient has  taken no previous anticoagulant or antiplatelet                         agents. ASA Grade Assessment: III - A patient with                         severe systemic disease. After reviewing the  risks and                         benefits, the patient was deemed in satisfactory                         condition to undergo the procedure. The anesthesia                         plan was to use monitored anesthesia care (MAC).                         Immediately prior to administration of medications,                         the patient was re-assessed for adequacy to receive                         sedatives. The heart rate, respiratory rate, oxygen                         saturations, blood pressure, adequacy of pulmonary                         ventilation, and response to care were monitored                         throughout the procedure. The physical status of the                         patient was re-assessed after the procedure.                        After obtaining informed consent, the endoscope was                         passed under direct vision. Throughout the procedure,                         the patient's blood pressure, pulse, and oxygen                         saturations were monitored continuously. The Endoscope                         was introduced through the mouth, and advanced to the                         second part of duodenum. The upper GI endoscopy was                         accomplished without difficulty. The patient tolerated  the procedure well. Findings:      A small hiatal hernia was present.      The exam of the esophagus was otherwise normal.      The entire examined stomach was normal.      The examined duodenum was normal. Impression:            - Small hiatal hernia.                        - Normal stomach.                        - Normal examined duodenum.                        - No specimens collected. Recommendation:        - Resume previous diet.                        - Continue present medications.                        - Return patient to hospital ward for ongoing care.                        - Advance  diet as tolerated. Procedure Code(s):     --- Professional ---                        [phone], Esophagogastroduodenoscopy, flexible,                         transoral; diagnostic, including collection of                         specimen(s) by brushing or washing, when performed                         (separate procedure) Diagnosis Code(s):     --- Professional ---                        K44.9, Diaphragmatic hernia without obstruction or                         gangrene                        K92.2, Gastrointestinal hemorrhage, unspecified CPT copyright 2019 American Medical Association. All rights reserved. The codes documented in this report are preliminary and upon coder review may  be revised to meet current compliance requirements. Andrey Farmer, MD Andrey Farmer MD, MD 07/26/2020 3:48:37 PM Number of Addenda: 0 Note Initiated On: 07/26/2020 3:17 PM Estimated Blood Loss:  Estimated blood loss: none.      [facility]

## 2020-07-26 NOTE — Evaluation (Signed)
Physical Therapy Evaluation Patient Details Name: Xavier Foster MRN: [id number] DOB: Apr 03, 1929 Today's Date: 07/26/2020   History of Present Illness  Pt admitted for acute blood loss anemia with complaints of emesis and GI bleed. Pt also reports fall in bathroom due to weakness. History includes HTN, [name], and gout. Planning for EGD/colonoscopy this date  Clinical Impression  Pt is a pleasant 86 year old male who was admitted for acute blood loss with anemia. Pt performs bed mobility with cga, transfers with min assist, and ambulation with min assist. No AD available for session, however pt uses rollater at baseline. Anticipate improved safety awareness with AD and decreased falls risk. Pt demonstrates deficits with strength/mobility/endurance. Would benefit from skilled PT to address above deficits and promote optimal return to PLOF. Recommend transition to [place] upon discharge from acute hospitalization.     Follow Up Recommendations Home health PT;Supervision for mobility/OOB    Equipment Recommendations  None recommended by PT    Recommendations for Other Services       Precautions / Restrictions Precautions Precautions: Fall Restrictions Weight Bearing Restrictions: No      Mobility  Bed Mobility Overal bed mobility: Needs Assistance Bed Mobility: Supine to Sit     Supine to sit: Min guard     General bed mobility comments: safe technique. UPright posture noted    Transfers Overall transfer level: Needs assistance Equipment used: 1 person hand held assist Transfers: Sit to/from Stand Sit to Stand: Min assist         General transfer comment: no AD available. Stood with 1 assist. Unsteady. Would much improve with RW use  Ambulation/Gait Ambulation/Gait assistance: Min assist Gait Distance (Feet): 3 Feet Assistive device: 1 person hand held assist Gait Pattern/deviations: Step-to pattern     General Gait Details: shuffle gait while taking a few steps  over to [facility] and back to bed. No AD available. Unsteady  Stairs            Wheelchair Mobility    Modified Rankin (Stroke Patients Only)       Balance Overall balance assessment: Needs assistance Sitting-balance support: Single extremity supported Sitting balance-Leahy Scale: Good     Standing balance support: Single extremity supported Standing balance-Leahy Scale: Poor                               Pertinent Vitals/Pain Pain Assessment: Faces Faces Pain Scale: Hurts a little bit Pain Location: buttock Pain Descriptors / Indicators: Aching Pain Intervention(s): Limited activity within patient's tolerance    Home Living Family/patient expects to be discharged to:: Private residence Living Arrangements: Spouse/significant other Available Help at Discharge: Family Type of Home: House Home Access: Stairs to enter Entrance Stairs-Rails: Right Entrance Stairs-Number of Steps: 2 Home Layout: One level Home Equipment: Walker - 4 wheels      Prior Function Level of Independence: Independent with assistive device(s)         Comments: using rollator for all mobility. No other recent falls     Hand Dominance        Extremity/Trunk Assessment   Upper Extremity Assessment Upper Extremity Assessment: Overall WFL for tasks assessed    Lower Extremity Assessment Lower Extremity Assessment: Generalized weakness (B LE grossly 3+/5; chronic B neuropathy)       Communication   Communication: No difficulties  Cognition Arousal/Alertness: Awake/alert Behavior During Therapy: WFL for tasks assessed/performed Overall Cognitive Status: Within Functional Limits for  tasks assessed                                 General Comments: HOH      General Comments      Exercises Other Exercises Other Exercises: supine/seated ther-ex performed on B LE including SLRs, heel slides, and LAQ. All ther-ex performed x 5-10 reps with cga. Cues for  sequencing and safe technique   Assessment/Plan    PT Assessment Patient needs continued PT services  PT Problem List Decreased strength;Decreased activity tolerance;Decreased balance;Decreased mobility       PT Treatment Interventions Gait training;Therapeutic exercise;Balance training    PT Goals (Current goals can be found in the Care Plan section)  Acute Rehab PT Goals Patient Stated Goal: to go home PT Goal Formulation: With patient Time For Goal Achievement: 08/09/20 Potential to Achieve Goals: Good    Frequency Min 2X/week   Barriers to discharge        Co-evaluation               AM-PAC PT "6 Clicks" Mobility  Outcome Measure Help needed turning from your back to your side while in a flat bed without using bedrails?: None Help needed moving from lying on your back to sitting on the side of a flat bed without using bedrails?: None Help needed moving to and from a bed to a chair (including a wheelchair)?: None Help needed standing up from a chair using your arms (e.g., wheelchair or bedside chair)?: None Help needed to walk in hospital room?: A Lot Help needed climbing 3-5 steps with a railing? : A Lot 6 Click Score: 20    End of Session Equipment Utilized During Treatment: Gait belt Activity Tolerance: Patient tolerated treatment well Patient left: in bed;with family/visitor present Nurse Communication: Mobility status PT Visit Diagnosis: Unsteadiness on feet (R26.81);Muscle weakness (generalized) (M62.81);History of falling (Z91.81);Difficulty in walking, not elsewhere classified (R26.2)    Time: 3007-6226 PT Time Calculation (min) (ACUTE ONLY): 21 min   Charges:   PT Evaluation $PT Eval Low Complexity: 1 Low PT Treatments $Therapeutic Exercise: 8-22 mins        [name], PT, DPT [phone]   [name] 07/26/2020, 11:59 AM

## 2020-07-26 NOTE — Progress Notes (Signed)
PROGRESS NOTE  Xavier Foster BUL:[id number] DOB: 28-Nov-1928 DOA: 07/25/2020 PCP: [place] Pink, MD  HPI/Recap of past 24 hours: Xavier Foster a 86 y.o.malewith medical history significant forHTN, [name], gout and arthritis on meloxicam, as well as constipation and bleeding hemorrhoidsbrought in by EMS following a fall preceded by dizziness as he attempted to get up off the commode. He hit his head but did not lose consciousness. Has been passing bright red blood per rectum intermittently for the past 3 days which he initially thought was related to straining to have a bowel movement however the episodes became more frequent, heavier and with clots. He denies black tarry stool.  Denies abdominal pain. He denies chest pain or shortness of breath or palpitations but feels very weak and fatigued.Also reports intermittent nausea with vomiting.  ED Course:On arrival, he was afebrile, mildly hypothermic at 94.7, heart rate 70 with BP 166/65 O2 sat 96% on room air. During his ED course he became more tachycardic up to 140 but this improved with IV fluid bolus. He also reportedly had a bradycardic event while in the ER. Blood work significant for hemoglobin of 9.5, down from his baseline of 13.6 in July on Care Everywhere. Creatinine 1.98 above his baseline of 1.2 in July 2021. BUN 50, glucose 221, potassium 3.2.  EKG as reviewed by me :Normal sinus rhythm at 52 with no acute ST-T wave changes Imaging:Chest x-ray clear, head CT with no acute trauma. CT angio abdomen and pelvis showed no focal contrast extravasation seen to localize GI bleed. Extensive sigmoid diverticulosis Patient was treated with an IV fluid bolus and received 2 units PRBC transfusion.  Hospitalist consulted for admission.  07/26/20:  Seen and examined with a son at his bedside.  He denies any bright red blood per rectum with his bowel prep today.  Plan for EGD and endoscopy this afternoon.  Acute drop in his  hemoglobin from 13.2-11.8.   Assessment/Plan: Principal Problem:   Acute blood loss anemia Active Problems:   HTN (hypertension)   [name] (hyperlipidemia)   Gout   Acute kidney injury superimposed on CKD IIIa (HCC)   Melena   Hematochezia   Symptomatic anemia   Arthritis   GI bleed   Long term current use of non-steroidal anti-inflammatories (NSAID)   Postural dizziness with presyncope  Acute blood loss anemia in the setting of GI bleed, hematochezia Presented with 3 days of bright red blood per rectum with acute blood loss with hemoglobin drop from baseline of 13 to 9, received 2 units PRBCs on 07/25/2020, corrected to 13.2. Seen by GI with plan for EGD and colonoscopy on 07/26/2020 Hg drop 11.8 this morning Continue to monitor H&H  GI bleed, unspecified Follow-up EGD and colonoscopy results Resume Nexium acid above  AKI on CKD 3B likely prerenal in the setting of dehydration. Presented with creatinine of 1.98 with GFR of 32 Baseline creatinine appears to be 1.8 GFR 48 Avoid nephrotoxins and dehydration Monitor urine output Repeat renal panel in the morning  Intermittent nausea with vomiting, unclear etiology Urine analysis negative IV antiemetics as needed  Leukocytosis, suspect reactive in the setting of ongoing GI bleed Currently afebrile, continue to monitor. CBC with differentials in the morning  Essential hypertension BP is not at goal Resume amlodipine. Continue to closely monitor vital signs  Hyperlipidemia/vitamin B12 deficiency Resume home Lipitor Resume home vitamin B12 supplement  Generalized weakness/ambulatory dysfunction PT assessment recommended mobile PT OT Continue PT OT with assistance TOC consulted to assist  with home health services arrangement Continue fall precautions     Code Status: Full code  Family Communication: Updated his son at bedside  Disposition Plan: Likely will discharge to home with home health services PT  OT.   Consultants:  GI  Procedures:  Planned EGD and colonoscopy on 07/26/2020  Antimicrobials: None  DVT prophylaxis:  SCDs  Status is: Inpatient    Dispo:  Patient From: Home  Planned Disposition: Home  Expected discharge date: 07/27/2020  Medically stable for discharge: No , ongoing management of GI bleed and acute blood loss anemia.         Objective: Vitals:   07/26/20 0715 07/26/20 0730 07/26/20 0830 07/26/20 1132  BP:   (!) 173/89 (!) 167/90  Pulse: 80 83 76 89  Resp: (!) 23 (!) 24 19 17   Temp: 99.4 F (37.4 C) 99.3 F (37.4 C) 99.3 F (37.4 C) 99.2 F (37.3 C)  TempSrc:    Oral  SpO2: 96% 96% 96% 100%  Weight:    64.6 kg  Height:    5\' 7"  (1.702 m)   No intake or output data in the 24 hours ending 07/26/20 1256 Filed Weights   07/25/20 0111 07/26/20 1132  Weight: 68 kg 64.6 kg    Exam:  . General: 86 y.o. year-old male well developed well nourished in no acute distress.  Alert and oriented x3.  Very hard of hearing. . Cardiovascular: Regular rate and rhythm with no rubs or gallops.  No thyromegaly or JVD noted.   Marland Kitchen Respiratory: Clear to auscultation with no wheezes or rales. Good inspiratory effort. . Abdomen: Soft nontender nondistended with normal bowel sounds x4 quadrants. . Musculoskeletal: No lower extremity edema. 2/4 pulses in all 4 extremities. . Skin: No ulcerative lesions noted or rashes, . Psychiatry: Mood is appropriate for condition and setting   Data Reviewed: CBC: Recent Labs  Lab 07/25/20 0122 07/25/20 0503 07/26/20 0623  WBC 16.8* 3.8* 13.9*  NEUTROABS 12.4* 3.3 11.5*  HGB 9.5* 13.2 11.8*  HCT 28.4* 38.8* 33.8*  MCV 88.8 87.4 86.2  PLT 218 169 161   Basic Metabolic Panel: Recent Labs  Lab 07/25/20 0122 07/25/20 0503 07/26/20 0623  NA 136 140 137  K 3.2* 3.3* 4.2  CL 103 106 107  CO2 21* 21* 21*  GLUCOSE 221* 169* 117*  BUN 50* 41* 33*  CREATININE 1.98* 1.74* 1.46*  CALCIUM 8.6* 8.2* 8.6*  MG 1.8   --  1.7  PHOS 4.0  --   --    GFR: Estimated Creatinine Clearance: 31.3 mL/min (A) (by C-G formula based on SCr of 1.46 mg/dL (H)). Liver Function Tests: Recent Labs  Lab 07/25/20 0122 07/25/20 0503 07/26/20 0623  AST 20 26 30   ALT 15 15 15   ALKPHOS 47 47 37*  BILITOT 0.8 1.5* 1.3*  PROT 6.3* 5.8* 5.9*  ALBUMIN 3.7 3.4* 3.1*   No results for input(s): LIPASE, AMYLASE in the last 168 hours. No results for input(s): AMMONIA in the last 168 hours. Coagulation Profile: Recent Labs  Lab 07/25/20 0122  INR 1.0   Cardiac Enzymes: No results for input(s): CKTOTAL, CKMB, CKMBINDEX, TROPONINI in the last 168 hours. BNP (last 3 results) No results for input(s): PROBNP in the last 8760 hours. HbA1C: No results for input(s): HGBA1C in the last 72 hours. CBG: No results for input(s): GLUCAP in the last 168 hours. Lipid Profile: No results for input(s): CHOL, HDL, LDLCALC, TRIG, CHOLHDL, LDLDIRECT in the last 72 hours. Thyroid  Function Tests: No results for input(s): TSH, T4TOTAL, FREET4, T3FREE, THYROIDAB in the last 72 hours. Anemia Panel: No results for input(s): VITAMINB12, FOLATE, FERRITIN, TIBC, IRON, RETICCTPCT in the last 72 hours. Urine analysis:    Component Value Date/Time   COLORURINE STRAW (A) 07/25/2020 0924   APPEARANCEUR CLEAR (A) 07/25/2020 0924   LABSPEC 1.020 07/25/2020 0924   PHURINE 5.0 07/25/2020 0924   GLUCOSEU 50 (A) 07/25/2020 0924   HGBUR NEGATIVE 07/25/2020 0924   BILIRUBINUR NEGATIVE 07/25/2020 0924   KETONESUR 5 (A) 07/25/2020 0924   PROTEINUR NEGATIVE 07/25/2020 0924   NITRITE NEGATIVE 07/25/2020 0924   LEUKOCYTESUR NEGATIVE 07/25/2020 0924   Sepsis Labs: @LABRCNTIP (procalcitonin:4,lacticidven:4)  ) Recent Results (from the past 240 hour(s))  Resp Panel by RT-PCR (Flu A&B, Covid) Nasopharyngeal Swab     Status: None   Collection Time: 07/25/20  1:29 AM   Specimen: Nasopharyngeal Swab; Nasopharyngeal(NP) swabs in vial transport medium   Result Value Ref Range Status   SARS Coronavirus 2 by RT PCR NEGATIVE NEGATIVE Final    Comment: (NOTE) SARS-CoV-2 target nucleic acids are NOT DETECTED.  The SARS-CoV-2 RNA is generally detectable in upper respiratory specimens during the acute phase of infection. The lowest concentration of SARS-CoV-2 viral copies this assay can detect is 138 copies/mL. A negative result does not preclude SARS-Cov-2 infection and should not be used as the sole basis for treatment or other patient management decisions. A negative result may occur with  improper specimen collection/handling, submission of specimen other than nasopharyngeal swab, presence of viral mutation(s) within the areas targeted by this assay, and inadequate number of viral copies(<138 copies/mL). A negative result must be combined with clinical observations, patient history, and epidemiological information. The expected result is Negative.  Fact Sheet for Patients:  EntrepreneurPulse.com.au  Fact Sheet for Healthcare Providers:  IncredibleEmployment.be  This test is no t yet approved or cleared by the [place] FDA and  has been authorized for detection and/or diagnosis of SARS-CoV-2 by FDA under an Emergency Use Authorization (EUA). This EUA will remain  in effect (meaning this test can be used) for the duration of the COVID-19 declaration under Section 564(b)(1) of the Act, 21 U.S.C.section 360bbb-3(b)(1), unless the authorization is terminated  or revoked sooner.       Influenza A by PCR NEGATIVE NEGATIVE Final   Influenza B by PCR NEGATIVE NEGATIVE Final    Comment: (NOTE) The Xpert Xpress SARS-CoV-2/FLU/RSV plus assay is intended as an aid in the diagnosis of influenza from Nasopharyngeal swab specimens and should not be used as a sole basis for treatment. Nasal washings and aspirates are unacceptable for Xpert Xpress SARS-CoV-2/FLU/RSV testing.  Fact Sheet for  Patients: EntrepreneurPulse.com.au  Fact Sheet for Healthcare Providers: IncredibleEmployment.be  This test is not yet approved or cleared by the [place] FDA and has been authorized for detection and/or diagnosis of SARS-CoV-2 by FDA under an Emergency Use Authorization (EUA). This EUA will remain in effect (meaning this test can be used) for the duration of the COVID-19 declaration under Section 564(b)(1) of the Act, 21 U.S.C. section 360bbb-3(b)(1), unless the authorization is terminated or revoked.  Performed at [facility], [address], [place], [place] [address]   Urine Culture     Status: None   Collection Time: 07/25/20  9:24 AM   Specimen: Urine, Random  Result Value Ref Range Status   Specimen Description   Final    URINE, RANDOM Performed at [facility], [address],  [place], [place] [address]    Special Requests   Final    NONE Performed at [facility], [address] [place], [place] [address]    Culture   Final    NO GROWTH Performed at [facility], [place] [address] [place], [place] [address]    Report Status 07/26/2020 FINAL  Final      Studies: No results found.  Scheduled Meds: . sodium chloride   Intravenous Once  . Chlorhexidine Gluconate Cloth  6 each Topical Daily  . pantoprazole (PROTONIX) IV  40 mg Intravenous Q12H    Continuous Infusions: . sodium chloride Stopped (07/25/20 0231)     LOS: 1 day     Kayleen Memos, MD [facility] Pager [phone]  If 7PM-7AM, please contact night-coverage [email or web address] Password [facility] 07/26/2020, 12:56 PM

## 2020-07-26 NOTE — Evaluation (Signed)
Occupational Therapy Evaluation Patient Details Name: Xavier Foster MRN: [id number] DOB: 1929-05-12 Today's Date: 07/26/2020    History of Present Illness Pt admitted for acute blood loss anemia with complaints of emesis and GI bleed. Pt also reports fall in bathroom due to weakness. History includes HTN, [name], and gout. Planning for EGD/colonoscopy this date   Clinical Impression   Mr. Stubblefield was seen for OT evaluation this date. Prior to hospital admission, pt was modified independent for ADL management. He reports using a 4WW for household mobility and is generally able to perform bathing, dressing, and self-care tasks w/o physical assistance. Pt lives with his spouse in a 1 level home with 2 STE. Currently pt demonstrates impairments as described below (See OT problem list) which functionally limit his ability to perform ADL/self-care tasks. Pt currently requires MIN GUARD for functional mobility and toileting using a RW. Per nsg, pt up to [facility] prior to OT evaluation w/o a RW and was unsafe/unstable on feet. Strongly recommend use of RW for all STS/functional mobility attempts. Pt would benefit from skilled OT services to address noted impairments and functional limitations (see below for any additional details) in order to maximize safety and independence while minimizing falls risk and caregiver burden. Upon hospital discharge, recommend STR to maximize pt safety and return to PLOF.      Follow Up Recommendations  Home health OT;Supervision - Intermittent    Equipment Recommendations  3 in 1 bedside commode    Recommendations for Other Services       Precautions / Restrictions Precautions Precautions: Fall Precaution Comments: High Fall Restrictions Weight Bearing Restrictions: No      Mobility Bed Mobility Overal bed mobility: Needs Assistance Bed Mobility: Sit to Supine     Supine to sit: Min guard Sit to supine: Min guard   General bed mobility comments: safe  technique. UPright posture noted    Transfers Overall transfer level: Needs assistance Equipment used: Rolling walker (2 wheeled) Transfers: Sit to/from Stand Sit to Stand: Min guard         General transfer comment: Close min guard for safety. Pt requires RW for all functional mobility/transfers.    Balance Overall balance assessment: Needs assistance Sitting-balance support: Feet supported;No upper extremity supported Sitting balance-Leahy Scale: Good Sitting balance - Comments: Steady static sitting, reaching within BOS. No LOB appreciated during weight shift with functional activity.   Standing balance support: Bilateral upper extremity supported;During functional activity Standing balance-Leahy Scale: Poor Standing balance comment: Requires BUE support for standing balance.                           ADL either performed or assessed with clinical judgement   ADL Overall ADL's : Needs assistance/impaired     Grooming: Sitting;Set up;Supervision/safety   Upper Body Bathing: Sitting;Minimal assistance   Lower Body Bathing: Sit to/from stand;Minimal assistance;Sitting/lateral leans   Upper Body Dressing : Sitting;Set up;Supervision/safety   Lower Body Dressing: Sit to/from stand;Minimal assistance;With adaptive equipment Lower Body Dressing Details (indicate cue type and reason): Requires RW for STS Toilet Transfer: RW;Min Art therapist Details (indicate cue type and reason): Pt able to transfer off of BSC with use of RW. Cueing for safety. Toileting- Clothing Manipulation and Hygiene: Set up;Supervision/safety;Sit to/from stand;Sitting/lateral lean Toileting - Clothing Manipulation Details (indicate cue type and reason): Pt performs peri-care w/o need for physical assist this date.     Functional mobility during ADLs: Rolling walker;Set up;Supervision/safety;Cueing for sequencing;Cueing  for safety       Vision Patient Visual Report: No change  from baseline       Perception     Praxis      Pertinent Vitals/Pain Pain Assessment: No/denies pain Faces Pain Scale: Hurts a little bit Pain Location: buttock Pain Descriptors / Indicators: Aching Pain Intervention(s): Limited activity within patient's tolerance     Hand Dominance     Extremity/Trunk Assessment Upper Extremity Assessment Upper Extremity Assessment: Overall WFL for tasks assessed   Lower Extremity Assessment Lower Extremity Assessment: Generalized weakness       Communication Communication Communication: HOH   Cognition Arousal/Alertness: Awake/alert Behavior During Therapy: WFL for tasks assessed/performed Overall Cognitive Status: Within Functional Limits for tasks assessed                                 General Comments: HOH   General Comments  Pt noted with brusing across BUE this date. He reports they are from IV sticks while in the hospital.    Exercises Exercises: Other exercises Other Exercises Other Exercises: supine/seated ther-ex performed on B LE including SLRs, heel slides, and LAQ. All ther-ex performed x 5-10 reps with cga. Cues for sequencing and safe technique Other Exercises: OT facilitates toileting, peri-care, and toilet transfer using RW. Pt educated on safety/falls prevention t/o session. Pt also educated on role of OT in acute setting, safe shower transfer technique, and safe use of AE/DME for ADL management.   Shoulder Instructions      Home Living Family/patient expects to be discharged to:: Private residence Living Arrangements: Spouse/significant other (Per pt, spouse requires assistance for BADL tasks.) Available Help at Discharge: Family;Available 24 hours/day;Available PRN/intermittently (Adult son and daughter are available PRN. Pt lives with spouse who is not physically able to assist.) Type of Home: House Home Access: Stairs to enter [employer] of Steps: 2 Entrance Stairs-Rails:  Right Home Layout: One level     Bathroom Shower/Tub: Tub/shower unit;Walk-in shower (Pt reports having and using both.)   Bathroom Toilet: Standard     Home Equipment: Walker - 2 wheels;Walker - 4 wheels;Shower seat;Hand held shower head;Cane - single point          Prior Functioning/Environment Level of Independence: Independent with assistive device(s)        Comments: Pt reports he is generally modified independent for ADL management. He uses a 4WW for functional mobility and can generally bathe and dress himself. He also assists his wife with her ADLs including bathing. Pt denies additional falls in past year. Pt reports his adult son and/or daughter assist him with grocery shopping and meal prep. Pt drives, but only short distances ("less than a mile to get my haircut").        OT Problem List: Decreased strength;Decreased activity tolerance;Decreased safety awareness;Decreased knowledge of use of DME or AE;Impaired balance (sitting and/or standing)      OT Treatment/Interventions: Self-care/ADL training;Therapeutic exercise;Therapeutic activities;DME and/or AE instruction;Patient/family education;Energy conservation;Balance training    OT Goals(Current goals can be found in the care plan section) Acute Rehab OT Goals Patient Stated Goal: to go home OT Goal Formulation: With patient Time For Goal Achievement: 08/09/20 Potential to Achieve Goals: Good ADL Goals Pt Will Perform Grooming: standing;with modified independence (c LRAD PRN for improved safety and functional indep.) Pt Will Perform Lower Body Dressing: with modified independence;sit to/from stand;with adaptive equipment (c LRAD PRN for improved safety and functional indep.)  Pt Will Transfer to Toilet: bedside commode;with modified independence (c LRAD PRN for improved safety and functional indep.) Additional ADL Goal #1: Pt/caregiver will independently verbalize a plan to implement at least 3 learned falls  prevention strategies into daily routines/home environment for improved safety and functional independence upon hospital DC.  OT Frequency: Min 1X/week   Barriers to D/C: Decreased caregiver support          Co-evaluation              AM-PAC OT "6 Clicks" Daily Activity     Outcome Measure Help from another person eating meals?: None Help from another person taking care of personal grooming?: A Little Help from another person toileting, which includes using toliet, bedpan, or urinal?: A Little Help from another person bathing (including washing, rinsing, drying)?: A Little Help from another person to put on and taking off regular upper body clothing?: A Little Help from another person to put on and taking off regular lower body clothing?: A Little 6 Click Score: 19   End of Session Equipment Utilized During Treatment: Rolling walker;Other (comment) (BSC)  Activity Tolerance: Patient tolerated treatment well Patient left: in bed;with call bell/phone within reach;with bed alarm set  OT Visit Diagnosis: Other abnormalities of gait and mobility (R26.89);Muscle weakness (generalized) (M62.81)                Time: 1340-1404 OT Time Calculation (min): 24 min Charges:  OT General Charges $OT Visit: 1 Visit OT Evaluation $OT Eval Moderate Complexity: 1 Mod OT Treatments $Self Care/Home Management : 8-22 mins  Shara Blazing, M.S., OTR/L Ascom: [phone] 07/26/20, 3:11 PM

## 2020-07-26 NOTE — ED Notes (Signed)
Ariel NA to take patient to floor.

## 2020-07-27 ENCOUNTER — Encounter: Payer: Self-pay | Admitting: Gastroenterology

## 2020-07-27 LAB — CBC WITH DIFFERENTIAL/PLATELET
Abs Immature Granulocytes: 0.43 10*3/uL — ABNORMAL HIGH (ref 0.00–0.07)
Basophils Absolute: 0 10*3/uL (ref 0.0–0.1)
Basophils Relative: 0 %
Eosinophils Absolute: 0 10*3/uL (ref 0.0–0.5)
Eosinophils Relative: 0 %
HCT: 33 % — ABNORMAL LOW (ref 39.0–52.0)
Hemoglobin: 11.4 g/dL — ABNORMAL LOW (ref 13.0–17.0)
Immature Granulocytes: 3 %
Lymphocytes Relative: 4 %
Lymphs Abs: 0.5 10*3/uL — ABNORMAL LOW (ref 0.7–4.0)
MCH: 30.1 pg (ref 26.0–34.0)
MCHC: 34.5 g/dL (ref 30.0–36.0)
MCV: 87.1 fL (ref 80.0–100.0)
Monocytes Absolute: 0.9 10*3/uL (ref 0.1–1.0)
Monocytes Relative: 7 %
Neutro Abs: 11.9 10*3/uL — ABNORMAL HIGH (ref 1.7–7.7)
Neutrophils Relative %: 86 %
Platelets: 155 10*3/uL (ref 150–400)
RBC: 3.79 MIL/uL — ABNORMAL LOW (ref 4.22–5.81)
RDW: 15.1 % (ref 11.5–15.5)
Smear Review: NORMAL
WBC: 13.9 10*3/uL — ABNORMAL HIGH (ref 4.0–10.5)
nRBC: 0 % (ref 0.0–0.2)

## 2020-07-27 LAB — COMPREHENSIVE METABOLIC PANEL
ALT: 19 U/L (ref 0–44)
AST: 46 U/L — ABNORMAL HIGH (ref 15–41)
Albumin: 3.3 g/dL — ABNORMAL LOW (ref 3.5–5.0)
Alkaline Phosphatase: 65 U/L (ref 38–126)
Anion gap: 10 (ref 5–15)
BUN: 28 mg/dL — ABNORMAL HIGH (ref 8–23)
CO2: 23 mmol/L (ref 22–32)
Calcium: 9 mg/dL (ref 8.9–10.3)
Chloride: 102 mmol/L (ref 98–111)
Creatinine, Ser: 1.43 mg/dL — ABNORMAL HIGH (ref 0.61–1.24)
GFR, Estimated: 47 mL/min — ABNORMAL LOW (ref >60)
Glucose, Bld: 101 mg/dL — ABNORMAL HIGH (ref 70–99)
Potassium: 4 mmol/L (ref 3.5–5.1)
Sodium: 135 mmol/L (ref 135–145)
Total Bilirubin: 1.5 mg/dL — ABNORMAL HIGH (ref 0.3–1.2)
Total Protein: 6.5 g/dL (ref 6.5–8.1)

## 2020-07-27 LAB — MAGNESIUM: Magnesium: 1.9 mg/dL (ref 1.7–2.4)

## 2020-07-27 MED ORDER — POLYETHYLENE GLYCOL 3350 17 G PO PACK: 17.0000 g | PACK | Freq: Every day | ORAL | 0 refills | Status: AC

## 2020-07-27 NOTE — TOC Initial Note (Signed)
Transition of Care [facility]) - Initial/Assessment Note    Patient Details  Name: Xavier Foster MRN: [id number] Date of Birth: Jan 11, 1929  Transition of [facility] CM/SW Contact:    Beverly Sessions, RN Phone Number: 07/27/2020, 4:12 PM  Clinical Narrative:                 Patient admitted with GI bleeding Assessment completed with daughter Olin Hauser.   Patient lives at home with wife Daughter and son live locally and check on the patient and wife daily  PCP Kary Kos - children provide transportation.  Pharmacy [facility] - denies issues obtaining medications  Patient has Rollator and grab bars in the home  [facility] has been delivered to the room by [place] prior to discharge  [facility], [facility], [facility], [facility], [facility], and [facility] home health all are unable to accept patient at this time.  MD and RN notified.  Daughter Olin Hauser aware and to follow up with PCP about outpatient PT.  She states that she is not sure if that patient will be in agreement as he has declined in the past   Expected Discharge Plan: Home/Self Care Barriers to Discharge: No [facility] will accept this patient   Patient Goals and CMS Choice        Expected Discharge Plan and Services Expected Discharge Plan: Home/Self Care   Discharge Planning Services: CM Consult   Living arrangements for the past 2 months: Single Family Home Expected Discharge Date: 07/27/20               DME Arranged: 3-N-1 DME Agency: [employer] Date DME Agency Contacted: 07/27/20   Representative spoke with at DME Agency: Mardene Celeste            Prior Living Arrangements/Services Living arrangements for the past 2 months: [place] Lives with:: Spouse Patient language and need for interpreter reviewed:: Yes Do you feel safe going back to the place where you live?: Yes      Need for Family Participation in Patient Care: Yes (Comment) Care giver support system in place?: Yes  (comment)   Criminal Activity/Legal Involvement Pertinent to Current Situation/Hospitalization: No - Comment as needed  Activities of Daily Living Home Assistive Devices/Equipment: Blood pressure cuff,Cane (specify quad or straight),Eyeglasses,Hearing aid,Walker (specify type) ADL Screening (condition at time of admission) Patient's cognitive ability adequate to safely complete daily activities?: Yes Is the patient deaf or have difficulty hearing?: No Does the patient have difficulty seeing, even when wearing glasses/contacts?: No Does the patient have difficulty concentrating, remembering, or making decisions?: No Patient able to express need for assistance with ADLs?: Yes Does the patient have difficulty dressing or bathing?: No Independently performs ADLs?: Yes (appropriate for developmental age) Does the patient have difficulty walking or climbing stairs?: No Weakness of Legs: None Weakness of Arms/Hands: None  Permission Sought/Granted                  Emotional Assessment       Orientation: : Oriented to Place,Oriented to  Time,Oriented to Self,Oriented to Situation   Psych Involvement: No (comment)  Admission diagnosis:  Hemorrhagic shock ([place]) [R57.8] Acute blood loss anemia [D62] Acute GI bleeding [K92.2] AKI (acute kidney injury) ([place]) [N17.9] Patient Active Problem List   Diagnosis Date Noted  . HTN (hypertension) 07/25/2020  . [name] (hyperlipidemia) 07/25/2020  . Gout 07/25/2020  . Chronic kidney disease, stage 3a ([place]) 07/25/2020  . Acute kidney injury superimposed on CKD IIIa ([place]) 07/25/2020  .  Melena 07/25/2020  . Hematochezia 07/25/2020  . Acute blood loss anemia 07/25/2020  . Symptomatic anemia 07/25/2020  . Arthritis 07/25/2020  . GI bleed 07/25/2020  . Long term current use of non-steroidal anti-inflammatories (NSAID) 07/25/2020  . Postural dizziness with presyncope 07/25/2020  . Cholecystitis 08/07/2017   PCP:  [place] Pink, MD Pharmacy:    [facility] [place], [place] [address] [place] [address] Phone: [phone] Fax: [phone]     Social Determinants of Health (SDOH) Interventions    Readmission Risk Interventions No flowsheet data found.

## 2020-07-27 NOTE — Progress Notes (Signed)
Xavier Foster  A and O x 4 VSS. Pt tolerating diet well. No complaints of pain or nausea. IV removed intact, prescriptions given. Pt voices understanding of discharge instructions with no further questions. Pt discharged via wheelchair with axillary.   Allergies as of 07/27/2020      Reactions   Claritin [loratadine] Hives   Penicillins Hives      Medication List    TAKE these medications   amLODipine 5 MG tablet Commonly known as: NORVASC Take 5 mg by mouth daily.   atorvastatin 10 MG tablet Commonly known as: LIPITOR Take 10 mg by mouth at bedtime.   bifidobacterium infantis capsule Take 1 capsule by mouth daily.   latanoprost 0.005 % ophthalmic solution Commonly known as: XALATAN Place 1 drop into both eyes at bedtime.   multivitamin capsule Take 1 capsule by mouth daily.   polyethylene glycol 17 g packet Commonly known as: MiraLax Take 17 g by mouth daily.   vitamin B-12 500 MCG tablet Commonly known as: CYANOCOBALAMIN Take 500 mcg by mouth daily.            Durable Medical Equipment  (From admission, onward)         Start     Ordered   07/27/20 1236  For home use only DME 3 n 1  Once        07/27/20 1235   07/26/20 1627  For home use only DME 3 n 1  Once        07/26/20 1626          Vitals:   07/27/20 1044 07/27/20 1114  BP: (!) 117/59 112/64  Pulse: 91 90  Resp: (!) 27 20  Temp: 98.5 F (36.9 C) 98.5 F (36.9 C)  SpO2: 99% 98%    Xavier Foster

## 2020-07-27 NOTE — Discharge Summary (Signed)
Physician Discharge Summary  Xavier Foster SPQ:[id number] DOB: [birth year] DOA: 07/25/2020  PCP: Maryland Pink, MD  Admit date: 07/25/2020 Discharge date: 07/27/2020  Time spent: 40 minutes  Recommendations for Outpatient Follow-up:  1. Follow outpatient CBC/CMP 2. Follow pending surg path outpatient 3. Consider GI follow up outpatient  4. Continue bowel regimen outpatient  Discharge Diagnoses:  Principal Problem:   Acute blood loss anemia Active Problems:   HTN (hypertension)   [name] (hyperlipidemia)   Gout   Acute kidney injury superimposed on CKD IIIa (HCC)   Melena   Hematochezia   Symptomatic anemia   Arthritis   GI bleed   Long term current use of non-steroidal anti-inflammatories (NSAID)   Postural dizziness with presyncope   Discharge Condition: stable  Diet recommendation: heart healthy  Filed Weights   07/25/20 0111 07/26/20 1132  Weight: 68 kg 64.6 kg    History of present illness:  Xavier Foster 86 y.o.malewith medical history significant forHTN, [name], gout and arthritis on meloxicam, as well as constipation and bleeding hemorrhoidsbrought in by EMS following Xavier Foster fall preceded by dizziness (84) as he attempted to get up off the commode. He hit his head but did not lose consciousness. Has been passing bright red blood per rectum intermittently for the past 3 days which he initially thought was related to straining to have Xavier Foster bowel movement however the episodes became more frequent, heavier and with clots. He denies black tarry stool.  Denies abdominal pain. He denies chest pain or shortness of breath or palpitations but feels very weak and fatigued.Also reports intermittent nausea with vomiting.  He was admitted for fall with presyncope in setting of acute blood loss anemia 2/2 GI bleed.  He's now s/p EGD/colonoscopy with GI.  EGD with normal stomach, normal examined duodenum, small hiatal hernia.  Colonoscopy with poor prep, but notable for  polyps, internal hemorrhoids, and diverticulosis.  His hb was stable on 12/15 and he was discharged home with Xavier Foster plan for outpatient follow up.  He has pending pathology results.  Hospital Course:  Acute blood loss anemia in the setting of GI bleed, hematochezia Presented with 3 days of bright red blood per rectum with acute blood loss with hemoglobin drop from baseline of 13 to 9 S/p 2 units pRBC Hb today stable to 11/4 S/p EGD and colonoscopy (see reports) - colonoscopy was with poor prep, three 2-3 mm polyps (removed with cold snare), diverticulosis, internal hemorrhoids - procedure aborted due to poor prep - consider repeat colonoscopy outpatient if benefits outweigh risk Follow pending pathology results  GI bleed, unspecified He noted small amount of blood this am with BM, minimal  Hb stable Follow outpatient  Consider GI follow up outpatient if persistent  AKI on CKD 3B likely prerenal in the setting of dehydration. Presented with creatinine of 1.98 with GFR of 32 Baseline creatinine ~1.2 Creatinine 1.43 today Follow outpatient   Intermittent nausea with vomiting, unclear etiology CT with sigmoid diverticulosis  Leukocytosis, suspect reactive Mild, unclear etiology - ? Reactive Afebrile, but did have some elevated temps earlier in hospitalization - imaging without explanation.  Urine cx negative.   Follow outpatient.  Essential hypertension amlodipine  Hyperlipidemia/vitamin B12 deficiency Resume home Lipitor Resume home vitamin B12 supplement  Generalized weakness/ambulatory dysfunction PT assessment recommended mobile PT OT Continue PT OT with assistance TOC consulted to assist with home health services arrangement Continue fall precautions  Procedures: EGD colonooscopy  Consultations:  GI  Discharge Exam: Vitals:   07/27/20 1044  07/27/20 1114  BP: (!) 117/59 112/64  Pulse: 91 90  Resp: (!) 27 20  Temp: 98.5 F (36.9 C) 98.5 F (36.9 C)   SpO2: 99% 98%   No new complaints Eager to d/c home  Noted minimal amount of blood in stool this AM Daughter at bedside Discussed d/c plan  General: No acute distress. Cardiovascular: Heart sounds show Xavier Foster regular rate, and rhythm.  Lungs: Clear to auscultation bilaterally  Abdomen: Soft, nontender, nondistended Neurological: Alert and oriented 3. Moves all extremities . Cranial nerves II through XII grossly intact. Skin: Warm and dry. No rashes or lesions. Extremities: No clubbing or cyanosis. No edema.  Discharge Instructions   Discharge Instructions    Diet - low sodium heart healthy   Complete by: As directed    Discharge instructions   Complete by: As directed    You were seen for blood in your stool.  You had Xavier Foster colonoscopy which showed internal hemorrhoids and polyps (these were biopsied) and diverticulosis.  It was Xavier Foster poor overall study and it may need to be repeated in the future depending on the risks and benefits of additional procedures.  Please follow up with your PCP to discuss this further.  You may need to follow up with Dr. Haig Prophet outpatient as well.    Continue Xavier Foster bowel regimen at home with miralax and metamucil.   Please follow your biopsy results with Dr. Haig [place].   Please follow up repeat labs with your PCP.  We're sending you home with home health.   Return for new, recurrent, or worsening symptoms.  Please ask your PCP to request records from this hospitalization so they know what was done and what the next steps will be.   Increase activity slowly   Complete by: As directed      Allergies as of 07/27/2020      Reactions   Claritin [loratadine] Hives   Penicillins Hives      Medication List    TAKE these medications   amLODipine 5 MG tablet Commonly known as: NORVASC Take 5 mg by mouth daily.   atorvastatin 10 MG tablet Commonly known as: LIPITOR Take 10 mg by mouth at bedtime.   bifidobacterium infantis capsule Take 1 capsule by  mouth daily.   latanoprost 0.005 % ophthalmic solution Commonly known as: XALATAN Place 1 drop into both eyes at bedtime.   multivitamin capsule Take 1 capsule by mouth daily.   polyethylene glycol 17 g packet Commonly known as: MiraLax Take 17 g by mouth daily.   vitamin B-12 500 MCG tablet Commonly known as: CYANOCOBALAMIN Take 500 mcg by mouth daily.            Durable Medical Equipment  (From admission, onward)         Start     Ordered   07/27/20 1236  For home use only DME 3 n 1  Once        07/27/20 1235   07/26/20 1627  For home use only DME 3 n 1  Once        07/26/20 1626         Allergies  Allergen Reactions  . Claritin [Loratadine] Hives  . Penicillins Hives    Follow-up Information    Maryland Pink, MD. Go on 08/02/2020.   Specialty: Family Medicine Why: 8:15am appointment Contact information: [address] [place] [address] [phone]        Lesly Rubenstein, MD.  Specialty: Gastroenterology Why: Call for biopsy results and follow up as needed Contact information: [place] [address] [phone]                The results of significant diagnostics from this hospitalization (including imaging, microbiology, ancillary and laboratory) are listed below for reference.    Significant Diagnostic Studies: CT HEAD WO CONTRAST  Result Date: 07/25/2020 CLINICAL DATA:  Head trauma, emesis, incontinence EXAM: CT HEAD WITHOUT CONTRAST TECHNIQUE: Contiguous axial images were obtained from the base of the skull through the vertex without intravenous contrast. COMPARISON:  None. FINDINGS: Brain: Normal anatomic configuration. Parenchymal volume loss is commensurate with the patient's age. Mild periventricular white matter changes are present likely reflecting the sequela of small vessel ischemia. No abnormal intra or extra-axial mass lesion or fluid collection. No abnormal mass effect or  midline shift. No evidence of acute intracranial hemorrhage or infarct. Ventricular size is normal. Cerebellum unremarkable. Vascular: No asymmetric hyperdense vasculature at the skull base. Skull: Intact Sinuses/Orbits: Paranasal sinuses are clear. Orbits are unremarkable. Other: Mastoid air cells and middle ear cavities are clear. IMPRESSION: Mild senescent changes. No acute intracranial abnormality. No calvarial fracture. Electronically Signed   By: Fidela Salisbury MD   On: 07/25/2020 01:45   DG Chest Portable 1 View  Result Date: 07/25/2020 CLINICAL DATA:  Vomiting EXAM: PORTABLE CHEST 1 VIEW COMPARISON:  08/07/2017 FINDINGS: The heart size and mediastinal contours are within normal limits. Both lungs are clear. The visualized skeletal structures are unremarkable. IMPRESSION: No active disease. Electronically Signed   By: Fidela Salisbury MD   On: 07/25/2020 01:51   CT Angio Abd/Pel W and/or Wo Contrast  Result Date: 07/25/2020 CLINICAL DATA:  GI bleed EXAM: CTA ABDOMEN AND PELVIS WITHOUT AND WITH CONTRAST TECHNIQUE: Multidetector CT imaging of the abdomen and pelvis was performed using the standard protocol during bolus administration of intravenous contrast. Multiplanar reconstructed images and MIPs were obtained and reviewed to evaluate the vascular anatomy. CONTRAST:  42mL OMNIPAQUE IOHEXOL 350 MG/ML SOLN COMPARISON:  08/07/2017 FINDINGS: VASCULAR Aorta: Heavily calcified aorta.  No aneurysm. Celiac: Patent SMA: Patent Renals: 1 right renal artery and 2 left renal arteries, patent. IMA: Patent Inflow: Calcified common iliac arteries.  Patent. Proximal Outflow: Calcifications, patent. Veins: No obvious venous abnormality within the limitations of this arterial phase study. Review of the MIP images confirms the above findings. NON-VASCULAR Lower chest: No acute abnormality. Hepatobiliary: Scattered hypodensities in the liver compatible with cysts. Prior cholecystectomy. Pancreas: No focal abnormality or  ductal dilatation. Spleen: No focal abnormality.  Normal size. Adrenals/Urinary Tract: Bilateral renal cysts. No hydronephrosis. Adrenal glands unremarkable. Urinary bladder decompressed with Foley catheter in place. Stomach/Bowel: Extensive sigmoid diverticulosis. No active diverticulitis. No visible active contrast extravasation to localize GI bleed. Stomach and small bowel decompressed, unremarkable. Lymphatic: No adenopathy Reproductive: Prostate enlargement Other: No free fluid or free air. Musculoskeletal: No acute bony abnormality. Degenerative changes in the lumbar spine. IMPRESSION: VASCULAR No focal contrast extravasation seen to localize GI bleed. Extensive aortic and iliac calcifications. NON-VASCULAR Extensive sigmoid diverticulosis. Electronically Signed   By: Rolm Baptise M.D.   On: 07/25/2020 03:10    Microbiology: Recent Results (from the past 240 hour(s))  Resp Panel by RT-PCR (Flu Edin Kon&B, Covid) Nasopharyngeal Swab     Status: None   Collection Time: 07/25/20  1:29 AM   Specimen: Nasopharyngeal Swab; Nasopharyngeal(NP) swabs in vial transport medium  Result Value Ref Range Status   SARS Coronavirus 2 by  RT PCR NEGATIVE NEGATIVE Final    Comment: (NOTE) SARS-CoV-2 target nucleic acids are NOT DETECTED.  The SARS-CoV-2 RNA is generally detectable in upper respiratory specimens during the acute phase of infection. The lowest concentration of SARS-CoV-2 viral copies this assay can detect is 138 copies/mL. Xavier Foster negative result does not preclude SARS-Cov-2 infection and should not be used as the sole basis for treatment or other patient management decisions. Xavier Foster negative result may occur with  improper specimen collection/handling, submission of specimen other than nasopharyngeal swab, presence of viral mutation(s) within the areas targeted by this assay, and inadequate number of viral copies(<138 copies/mL). Xavier Foster negative result must be combined with clinical observations, patient history,  and epidemiological information. The expected result is Negative.  Fact Sheet for Patients:  EntrepreneurPulse.com.au  Fact Sheet for Healthcare Providers:  IncredibleEmployment.be  This test is no t yet approved or cleared by the [place] FDA and  has been authorized for detection and/or diagnosis of SARS-CoV-2 by FDA under an Emergency Use Authorization (EUA). This EUA will remain  in effect (meaning this test can be used) for the duration of the COVID-19 declaration under Section 564(b)(1) of the Act, 21 U.S.C.section 360bbb-3(b)(1), unless the authorization is terminated  or revoked sooner.       Influenza Xavier Foster by PCR NEGATIVE NEGATIVE Final   Influenza B by PCR NEGATIVE NEGATIVE Final    Comment: (NOTE) The Xpert Xpress SARS-CoV-2/FLU/RSV plus assay is intended as an aid in the diagnosis of influenza from Nasopharyngeal swab specimens and should not be used as Xavier Foster sole basis for treatment. Nasal washings and aspirates are unacceptable for Xpert Xpress SARS-CoV-2/FLU/RSV testing.  Fact Sheet for Patients: EntrepreneurPulse.com.au  Fact Sheet for Healthcare Providers: IncredibleEmployment.be  This test is not yet approved or cleared by the [place] FDA and has been authorized for detection and/or diagnosis of SARS-CoV-2 by FDA under an Emergency Use Authorization (EUA). This EUA will remain in effect (meaning this test can be used) for the duration of the COVID-19 declaration under Section 564(b)(1) of the Act, 21 U.S.C. section 360bbb-3(b)(1), unless the authorization is terminated or revoked.  Performed at [facility], [address] [place], [place] [address]   Urine Culture     Status: None   Collection Time: 07/25/20  9:24 AM   Specimen: Urine, Random  Result Value Ref Range Status   Specimen Description   Final    URINE, RANDOM Performed at [facility], [address], [place], [place] [address]    Special Requests   Final    NONE Performed at [facility], [address], [place], [place] [address]    Culture   Final    NO GROWTH Performed at [facility], [place] [address], [place], [place] [address]    Report Status 07/26/2020 FINAL  Final     Labs: Basic Metabolic Panel: Recent Labs  Lab 07/25/20 0122 07/25/20 0503 07/26/20 0623 07/27/20 0535  NA 136 140 137 135  K 3.2* 3.3* 4.2 4.0  CL 103 106 107 102  CO2 21* 21* 21* 23  GLUCOSE 221* 169* 117* 101*  BUN 50* 41* 33* 28*  CREATININE 1.98* 1.74* 1.46* 1.43*  CALCIUM 8.6* 8.2* 8.6* 9.0  MG 1.8  --  1.7 1.9  PHOS 4.0  --   --   --    Liver Function Tests: Recent Labs  Lab 07/25/20 0122 07/25/20 0503 07/26/20 0623 07/27/20 0535  AST 20 26 30  46*  ALT 15 15 15 19   ALKPHOS 47 47 37* 65  BILITOT 0.8 1.5* 1.3* 1.5*  PROT 6.3* 5.8* 5.9* 6.5  ALBUMIN 3.7 3.4* 3.1* 3.3*   No results for input(s): LIPASE, AMYLASE in the last 168 hours. No results for input(s): AMMONIA in the last 168 hours. CBC: Recent Labs  Lab 07/25/20 0122 07/25/20 0503 07/26/20 0623 07/27/20 0802  WBC 16.8* 3.8* 13.9* 13.9*  NEUTROABS 12.4* 3.3 11.5* 11.9*  HGB 9.5* 13.2 11.8* 11.4*  HCT 28.4* 38.8* 33.8* 33.0*  MCV 88.8 87.4 86.2 87.1  PLT 218 169 150 155   Cardiac Enzymes: No results for input(s): CKTOTAL, CKMB, CKMBINDEX, TROPONINI in the last 168 hours. BNP: BNP (last 3 results) No results for input(s): BNP in the last 8760 hours.  ProBNP (last 3 results) No results for input(s): PROBNP in the last 8760 hours.  CBG: No results for input(s): GLUCAP in the last 168 hours.     Signed:  Fayrene Helper MD.  [facility] 07/27/2020, 9:17 PM

## 2020-07-28 LAB — SURGICAL PATHOLOGY

## 2021-06-04 ENCOUNTER — Other Ambulatory Visit: Payer: Self-pay

## 2021-06-04 ENCOUNTER — Encounter: Payer: Self-pay | Admitting: Emergency Medicine

## 2021-06-04 ENCOUNTER — Emergency Department
Admission: EM | Admit: 2021-06-04 | Discharge: 2021-06-04 | Disposition: A | Discharge status: Home / Self Care | Payer: Medicare Other | Attending: Emergency Medicine | Admitting: Emergency Medicine

## 2021-06-04 ENCOUNTER — Emergency Department: Payer: Medicare Other

## 2021-06-04 DIAGNOSIS — R110 Nausea: Secondary | ICD-10-CM

## 2021-06-04 DIAGNOSIS — Z79899 Other long term (current) drug therapy: Secondary | ICD-10-CM | POA: Diagnosis not present

## 2021-06-04 DIAGNOSIS — N1831 Chronic kidney disease, stage 3a: Secondary | ICD-10-CM | POA: Insufficient documentation

## 2021-06-04 DIAGNOSIS — R058 Other specified cough: Secondary | ICD-10-CM

## 2021-06-04 DIAGNOSIS — U071 COVID-19: Secondary | ICD-10-CM | POA: Insufficient documentation

## 2021-06-04 DIAGNOSIS — I129 Hypertensive chronic kidney disease with stage 1 through stage 4 chronic kidney disease, or unspecified chronic kidney disease: Secondary | ICD-10-CM | POA: Diagnosis not present

## 2021-06-04 LAB — CBC WITH DIFFERENTIAL/PLATELET
Abs Immature Granulocytes: 0.02 10*3/uL (ref 0.00–0.07)
Basophils Absolute: 0 10*3/uL (ref 0.0–0.1)
Basophils Relative: 0 %
Eosinophils Absolute: 0 10*3/uL (ref 0.0–0.5)
Eosinophils Relative: 0 %
HCT: 41.4 % (ref 39.0–52.0)
Hemoglobin: 14.4 g/dL (ref 13.0–17.0)
Immature Granulocytes: 0 %
Lymphocytes Relative: 6 %
Lymphs Abs: 0.4 10*3/uL — ABNORMAL LOW (ref 0.7–4.0)
MCH: 30.3 pg (ref 26.0–34.0)
MCHC: 34.8 g/dL (ref 30.0–36.0)
MCV: 87 fL (ref 80.0–100.0)
Monocytes Absolute: 0.9 10*3/uL (ref 0.1–1.0)
Monocytes Relative: 15 %
Neutro Abs: 4.8 10*3/uL (ref 1.7–7.7)
Neutrophils Relative %: 79 %
Platelets: 161 10*3/uL (ref 150–400)
RBC: 4.76 MIL/uL (ref 4.22–5.81)
RDW: 14.4 % (ref 11.5–15.5)
WBC: 6.1 10*3/uL (ref 4.0–10.5)
nRBC: 0 % (ref 0.0–0.2)

## 2021-06-04 LAB — COMPREHENSIVE METABOLIC PANEL
ALT: 15 U/L (ref 0–44)
AST: 25 U/L (ref 15–41)
Albumin: 4.1 g/dL (ref 3.5–5.0)
Alkaline Phosphatase: 55 U/L (ref 38–126)
Anion gap: 12 (ref 5–15)
BUN: 24 mg/dL — ABNORMAL HIGH (ref 8–23)
CO2: 29 mmol/L (ref 22–32)
Calcium: 9.2 mg/dL (ref 8.9–10.3)
Chloride: 97 mmol/L — ABNORMAL LOW (ref 98–111)
Creatinine, Ser: 1.35 mg/dL — ABNORMAL HIGH (ref 0.61–1.24)
GFR, Estimated: 50 mL/min — ABNORMAL LOW (ref >60)
Glucose, Bld: 105 mg/dL — ABNORMAL HIGH (ref 70–99)
Potassium: 4.2 mmol/L (ref 3.5–5.1)
Sodium: 138 mmol/L (ref 135–145)
Total Bilirubin: 1.2 mg/dL (ref 0.3–1.2)
Total Protein: 7.4 g/dL (ref 6.5–8.1)

## 2021-06-04 LAB — TROPONIN I (HIGH SENSITIVITY): Troponin I (High Sensitivity): 18 ng/L — ABNORMAL HIGH (ref <18)

## 2021-06-04 MED ORDER — NIRMATRELVIR/RITONAVIR (PAXLOVID)TABLET: 3.0000 | ORAL_TABLET | Freq: Two times a day (BID) | ORAL | 0 refills | Status: AC

## 2021-06-04 MED ORDER — ONDANSETRON HCL 4 MG/2ML IJ SOLN
4.0000 mg | Freq: Once | INTRAMUSCULAR | Status: AC
  Administered 2021-06-04: 4 mg via INTRAVENOUS
  Filled 2021-06-04: qty 2

## 2021-06-04 MED ORDER — SODIUM CHLORIDE 0.9 % IV BOLUS
1000.0000 mL | Freq: Once | INTRAVENOUS | Status: AC
  Administered 2021-06-04: 1000 mL via INTRAVENOUS

## 2021-06-04 MED ORDER — BENZONATATE 100 MG PO CAPS: 100.0000 mg | ORAL_CAPSULE | Freq: Four times a day (QID) | ORAL | 0 refills | Status: AC | PRN

## 2021-06-04 NOTE — ED Triage Notes (Signed)
Pt in via EMS from home with c/o being covid +. Pt with NV and productive cough with yellp phlem. Pt denies pain. 99.4 temp, 98% RA.    Pt reports that his throat hurts real bad. Pt denies SOB to Rn, reports his throat is bad

## 2021-06-04 NOTE — ED Notes (Signed)
Assisted with dressing. Pt Iv removed. Pt given information about rx. Pt assisted to wheelchair to [facility] for family pickup. Pt questions answered and pt verbalized dc consent

## 2021-06-04 NOTE — ED Provider Notes (Signed)
[facility] Emergency Department Provider Note   ____________________________________________   Event Date/Time   First MD Initiated Contact with Patient 06/04/21 1221     (approximate)  I have reviewed the triage vital signs and the nursing notes.   HISTORY  Chief Complaint No chief complaint on file.   HPI Tatsuo Musial is a 87 y.o. male with recent COVID positivity who presents with nausea, vomiting, and productive cough that has been worsening over the last 3 days.  Patient has not taken any medication to try to alleviate the symptoms and has not been on any antiviral therapy.  Patient denies any chest pain or shortness of breath.  Patient states that he becomes nauseous after a "coughing fit" in which he coughs up yellow sputum.  Patient also endorses fevers at home up to 102 for which she is taking Tylenol.          Past Medical History:  Diagnosis Date   High cholesterol    Hypertension     Patient Active Problem List   Diagnosis Date Noted   HTN (hypertension) 07/25/2020   [name] (hyperlipidemia) 07/25/2020   Gout 07/25/2020   Chronic kidney disease, stage 3a ([place]) 07/25/2020   Acute kidney injury superimposed on CKD IIIa ([place]) 07/25/2020   Melena 07/25/2020   Hematochezia 07/25/2020   Acute blood loss anemia 07/25/2020   Symptomatic anemia 07/25/2020   Arthritis 07/25/2020   GI bleed 07/25/2020   Long term current use of non-steroidal anti-inflammatories (NSAID) 07/25/2020   Postural dizziness with presyncope 07/25/2020   Cholecystitis 08/07/2017    Past Surgical History:  Procedure Laterality Date   CHOLECYSTECTOMY N/A 08/08/2017   Procedure: LAPAROSCOPIC CHOLECYSTECTOMY;  Surgeon: Ileana Roup, MD;  Location: [facility];  Service: General;  Laterality: N/A;   COLONOSCOPY N/A 07/26/2020   Procedure: COLONOSCOPY;  Surgeon: Lesly Rubenstein, MD;  Location: [facility] ENDOSCOPY;  Service: Endoscopy;  Laterality: N/A;    ESOPHAGOGASTRODUODENOSCOPY N/A 07/26/2020   Procedure: ESOPHAGOGASTRODUODENOSCOPY (EGD);  Surgeon: Lesly Rubenstein, MD;  Location: [facility] ENDOSCOPY;  Service: Endoscopy;  Laterality: N/A;    Prior to Admission medications   Medication Sig Start Date End Date Taking? Authorizing Provider  benzonatate (TESSALON PERLES) 100 MG capsule Take 1 capsule (100 mg total) by mouth every 6 (six) hours as needed for cough. 06/04/21 06/04/22 Yes Naaman Plummer, MD  nirmatrelvir/ritonavir EUA (PAXLOVID) 20 x 150 MG & 10 x 100MG  TABS Take 3 tablets by mouth 2 (two) times daily for 5 days. Patient GFR is 50. Take nirmatrelvir (150 mg) and one ritonavir (100mg ) together twice daily for 5 days 06/04/21 06/09/21 Yes Viviane Semidey, Vista Lawman, MD  amLODipine (NORVASC) 5 MG tablet Take 5 mg by mouth daily. 06/14/17   [provider]  atorvastatin (LIPITOR) 10 MG tablet Take 10 mg by mouth at bedtime. 06/28/17   [provider]  bifidobacterium infantis (ALIGN) capsule Take 1 capsule by mouth daily.    [provider]  latanoprost (XALATAN) 0.005 % ophthalmic solution Place 1 drop into both eyes at bedtime. 06/29/20   [provider]  Multiple Vitamin (MULTIVITAMIN) capsule Take 1 capsule by mouth daily.    [provider]  polyethylene glycol (MIRALAX) 17 g packet Take 17 g by mouth daily. 07/27/20   Elodia Florence., MD  vitamin B-12 (CYANOCOBALAMIN) 500 MCG tablet Take 500 mcg by mouth daily.    [provider]    Allergies Claritin [loratadine] and Penicillins  No  family history on file.  Social History Social History   Tobacco Use   Smoking status: Never   Smokeless tobacco: Never  Substance Use Topics   Alcohol use: No   Drug use: No    Review of Systems Constitutional: Endorses fever/chills Eyes: No visual changes. ENT: No sore throat. Cardiovascular: Denies chest pain. Respiratory: Endorses cough productive of yellow sputum.  Denies shortness  of breath. Gastrointestinal: No abdominal pain.  Endorses nausea and vomiting.  No diarrhea. Genitourinary: Negative for dysuria. Musculoskeletal: Negative for acute arthralgias Skin: Negative for rash. Neurological: Negative for headaches, weakness/numbness/paresthesias in any extremity Psychiatric: Negative for suicidal ideation/homicidal ideation   ____________________________________________   PHYSICAL EXAM:  VITAL SIGNS: ED Triage Vitals  Enc Vitals Group     BP 06/04/21 1127 (!) 150/84     Pulse Rate 06/04/21 1127 95     Resp 06/04/21 1127 20     Temp 06/04/21 1127 (!) 100.4 F (38 C)     Temp Source 06/04/21 1127 Oral     SpO2 06/04/21 1127 97 %     Weight 06/04/21 1125 139 lb (63 kg)     Height 06/04/21 1125 5\' 6"  (1.676 m)     Head Circumference --      Peak Flow --      Pain Score 06/04/21 1125 10     Pain Loc --      Pain Edu? --      Excl. in [place]? --    Constitutional: Alert and oriented. Well appearing and in no acute distress. Eyes: Conjunctivae are normal. PERRL. Head: Atraumatic. Nose: No congestion/rhinnorhea. Mouth/Throat: Erythematous posterior oropharynx.  Mucous membranes are moist. Neck: No stridor Cardiovascular: Grossly normal heart sounds.  Good peripheral circulation. Respiratory: Normal respiratory effort.  No retractions. Gastrointestinal: Soft and nontender. No distention. Musculoskeletal: No obvious deformities Neurologic:  Normal speech and language. No gross focal neurologic deficits are appreciated. Skin:  Skin is warm and dry. No rash noted. Psychiatric: Mood and affect are normal. Speech and behavior are normal.  ____________________________________________   LABS (all labs ordered are listed, but only abnormal results are displayed)  Labs Reviewed  COMPREHENSIVE METABOLIC PANEL - Abnormal; Notable for the following components:      Result Value   Chloride 97 (*)    Glucose, Bld 105 (*)    BUN 24 (*)    Creatinine, Ser 1.35  (*)    GFR, Estimated 50 (*)    All other components within normal limits  CBC WITH DIFFERENTIAL/PLATELET - Abnormal; Notable for the following components:   Lymphs Abs 0.4 (*)    All other components within normal limits  TROPONIN I (HIGH SENSITIVITY) - Abnormal; Notable for the following components:   Troponin I (High Sensitivity) 18 (*)    All other components within normal limits  TROPONIN I (HIGH SENSITIVITY)   RADIOLOGY  ED MD interpretation: One-view portable chest x-ray shows no evidence of acute abnormalities including no pneumonia, pneumothorax, or widened mediastinum  Official radiology report(s): DG Chest Port 1 View  Result Date: 06/04/2021 CLINICAL DATA:  covid +, productive cough EXAM: PORTABLE CHEST 1 VIEW COMPARISON:  July 25, 2020 FINDINGS: The cardiomediastinal silhouette is unchanged in contour.Atherosclerotic calcifications. No pleural effusion. No pneumothorax. No acute pleuroparenchymal abnormality. Visualized abdomen is unremarkable. Multilevel degenerative changes of the thoracic spine. IMPRESSION: No acute cardiopulmonary abnormality. Electronically Signed   By: Valentino Saxon M.D.   On: 06/04/2021 12:49    ____________________________________________   PROCEDURES  Procedure(s)  performed (including Critical Care):  .1-3 Lead EKG Interpretation Performed by: Naaman Plummer, MD Authorized by: Naaman Plummer, MD     Interpretation: normal     ECG rate:  86   ECG rate assessment: normal     Rhythm: sinus rhythm     Ectopy: none     Conduction: normal     ____________________________________________   INITIAL IMPRESSION / ASSESSMENT AND PLAN / ED COURSE  As part of my medical decision making, I reviewed the following data within the electronic medical record, if available:  Nursing notes reviewed and incorporated, Labs reviewed, EKG interpreted, Old chart reviewed, Radiograph reviewed and Notes from prior ED visits reviewed and  incorporated        Presentation most consistent with Viral Syndrome.  Patient has tested positive for COVID-19 at home Based on vitals and exam they are nontoxic and stable for discharge.  Given History and Exam I have a lower suspicion for: Emergent CardioPulmonary causes [such as Acute Asthma or COPD Exacerbation, acute Heart Failure or exacerbation, PE, PTX, atypical ACS, PNA]. Emergent Otolaryngeal causes [such as PTA, RPA, Ludwigs, Epiglottitis, EBV].  Regarding Emergent Travel or Immunosuppressive related infectious: I have a low suspicion for acute HIV. Rx: Tessalon Perles, paxlovid Will provide strict return precautions and instructions on self-isolation/quarantine and anticipatory guidance.  Dispo: Discharge home with home care      ____________________________________________   FINAL CLINICAL IMPRESSION(S) / ED DIAGNOSES  Final diagnoses:  COVID-19 virus infection  Productive cough  Nausea     ED Discharge Orders          Ordered    benzonatate (TESSALON PERLES) 100 MG capsule  Every 6 hours PRN        06/04/21 1407    nirmatrelvir/ritonavir EUA (PAXLOVID) 20 x 150 MG & 10 x 100MG  TABS  2 times daily        06/04/21 1407             Note:  This document was prepared using Dragon voice recognition software and may include unintentional dictation errors.    Naaman Plummer, MD 06/04/21 1435

## 2021-06-07 ENCOUNTER — Ambulatory Visit: Payer: Medicare Other | Admitting: Dermatology

## 2021-06-26 ENCOUNTER — Ambulatory Visit: Payer: Medicare Other | Admitting: Dermatology

## 2021-06-26 ENCOUNTER — Other Ambulatory Visit: Payer: Self-pay

## 2021-06-26 DIAGNOSIS — L578 Other skin changes due to chronic exposure to nonionizing radiation: Secondary | ICD-10-CM | POA: Diagnosis not present

## 2021-06-26 DIAGNOSIS — C44311 Basal cell carcinoma of skin of nose: Secondary | ICD-10-CM | POA: Diagnosis not present

## 2021-06-26 DIAGNOSIS — D492 Neoplasm of unspecified behavior of bone, soft tissue, and skin: Secondary | ICD-10-CM

## 2021-06-26 DIAGNOSIS — L821 Other seborrheic keratosis: Secondary | ICD-10-CM | POA: Diagnosis not present

## 2021-06-26 DIAGNOSIS — D0339 Melanoma in situ of other parts of face: Secondary | ICD-10-CM | POA: Diagnosis not present

## 2021-06-26 DIAGNOSIS — D1801 Hemangioma of skin and subcutaneous tissue: Secondary | ICD-10-CM

## 2021-06-26 DIAGNOSIS — C4491 Basal cell carcinoma of skin, unspecified: Secondary | ICD-10-CM

## 2021-06-26 DIAGNOSIS — C439 Malignant melanoma of skin, unspecified: Secondary | ICD-10-CM

## 2021-06-26 HISTORY — DX: Basal cell carcinoma of skin, unspecified: C44.91

## 2021-06-26 HISTORY — DX: Malignant melanoma of skin, unspecified: C43.9

## 2021-06-26 NOTE — Progress Notes (Signed)
   New Patient Visit  Subjective  Xavier Foster is a 87 y.o. male who presents for the following: check sore (Nose ~4yr, has been growing).  New patient referral from Dr. [place] Pink.  Patient accompanied by daughter who contributes to history.  The following portions of the chart were reviewed this encounter and updated as appropriate:       Review of Systems:  No other skin or systemic complaints except as noted in HPI or Assessment and Plan.  Objective  Well appearing patient in no apparent distress; mood and affect are within normal limits.  A focused examination was performed including face. Relevant physical exam findings are noted in the Assessment and Plan.  L upper nasal dorsum 1.1cm pink flesh pap with central ulceration     R upper forehead 6.22mm brown macule with irregular pigment      Assessment & Plan   Seborrheic Keratoses - Stuck-on, waxy, tan-brown papules and/or plaques  - Benign-appearing - Discussed benign etiology and prognosis. - Observe - Call for any changes  Hemangiomas - Red papules - Discussed benign nature - Observe - Call for any changes   Actinic Damage - chronic, secondary to cumulative UV radiation exposure/sun exposure over time - diffuse scaly erythematous macules with underlying dyspigmentation - Recommend daily broad spectrum sunscreen SPF 30+ to sun-exposed areas, reapply every 2 hours as needed.  - Recommend staying in the shade or wearing long sleeves, sun glasses (UVA+UVB protection) and wide brim hats (4-inch brim around the entire circumference of the hat). - Call for new or changing lesions.   Neoplasm of skin (2) L upper nasal dorsum  Skin / nail biopsy Type of biopsy: tangential   Informed consent: discussed and consent obtained   Anesthesia: the lesion was anesthetized in a standard fashion   Anesthesia comment:  Area prepped with alcohol Anesthetic:  1% lidocaine w/ epinephrine 1-100,000 buffered  w/ 8.4% NaHCO3 Instrument used: flexible razor blade   Hemostasis achieved with: pressure, aluminum chloride and electrodesiccation   Outcome: patient tolerated procedure well   Post-procedure details: wound care instructions given   Post-procedure details comment:  Ointment and small bandage applied  Specimen 1 - Surgical pathology Differential Diagnosis: D48.5 R/O BCC  Check Margins: No 1.1cm pink flesh pap with central ulceration  R upper forehead  Epidermal / dermal shaving  Lesion diameter (cm):  0.8 Informed consent: discussed and consent obtained   Patient was prepped and draped in usual sterile fashion: area prepped with alcohol. Anesthesia: the lesion was anesthetized in a standard fashion   Anesthetic:  1% lidocaine w/ epinephrine 1-100,000 buffered w/ 8.4% NaHCO3 Instrument used: flexible razor blade   Hemostasis achieved with: pressure, aluminum chloride and electrodesiccation   Outcome: patient tolerated procedure well   Post-procedure details: wound care instructions given   Post-procedure details comment:  Ointment and small bandage applied  Specimen 2 - Surgical pathology Differential Diagnosis: D48.5 SK vs Dysplastic Nevus  Check Margins: No 6.25mm brown macule with irregular pigment  If L upper nasal dorsum BCC will plan EDC vs MOHs at [facility] in [place].  Return for prn pending bx results.Tia Alert, RMA, am acting as scribe for Brendolyn Patty, MD .  Documentation: I have reviewed the above documentation for accuracy and completeness, and I agree with the above.  Brendolyn Patty MD

## 2021-06-26 NOTE — Patient Instructions (Addendum)
If you have any questions or concerns for your doctor, please call our main line at [phone] and press option 4 to reach your doctor's medical assistant. If no one answers, please leave a voicemail as directed and we will return your call as soon as possible. Messages left after 4 pm will be answered the following business day.   You may also send us a message via MyChart. We typically respond to MyChart messages within 1-2 business days.  For prescription refills, please ask your pharmacy to contact our office. Our fax number is [phone].  If you have an urgent issue when the clinic is closed that cannot wait until the next business day, you can page your doctor at the number below.    Please note that while we do our best to be available for urgent issues outside of office hours, we are not available 24/7.   If you have an urgent issue and are unable to reach us, you may choose to seek medical care at your doctor's office, retail clinic, urgent care center, or emergency room.  If you have a medical emergency, please immediately call 911 or go to the emergency department.  Pager Numbers  - Dr. Kowalski: [phone]  - Dr. Moye: [phone]  - Dr. Stewart: [phone]  In the event of inclement weather, please call our main line at [phone] for an update on the status of any delays or closures.  Dermatology Medication Tips: Please keep the boxes that topical medications come in in order to help keep track of the instructions about where and how to use these. Pharmacies typically print the medication instructions only on the boxes and not directly on the medication tubes.   If your medication is too expensive, please contact our office at [phone] option 4 or send us a message through MyChart.   We are unable to tell what your co-pay for medications will be in advance as this is different depending on your insurance coverage. However, we may be able to find a substitute  medication at lower cost or fill out paperwork to get insurance to cover a needed medication.   If a prior authorization is required to get your medication covered by your insurance company, please allow us 1-2 business days to complete this process.  Drug prices often vary depending on where the prescription is filled and some pharmacies may offer cheaper prices.  The website www.goodrx.com contains coupons for medications through different pharmacies. The prices here do not account for what the cost may be with help from insurance (it may be cheaper with your insurance), but the website can give you the price if you did not use any insurance.  - You can print the associated coupon and take it with your prescription to the pharmacy.  - You may also stop by our office during regular business hours and pick up a GoodRx coupon card.  - If you need your prescription sent electronically to a different pharmacy, notify our office through [facility] MyChart or by phone at [phone] option 4.   Wound Care Instructions  Cleanse wound gently with soap and water once a day then pat dry with clean gauze. Apply a thing coat of Petrolatum (petroleum jelly, "Vaseline") over the wound (unless you have an allergy to this). We recommend that you use a new, sterile tube of Vaseline. Do not pick or remove scabs. Do not remove the yellow or white "healing tissue" from the base of the wound.  Cover the   wound with fresh, clean, nonstick gauze and secure with paper tape. You may use Band-Aids in place of gauze and tape if the would is small enough, but would recommend trimming much of the tape off as there is often too much. Sometimes Band-Aids can irritate the skin.  You should call the office for your biopsy report after 1 week if you have not already been contacted.  If you experience any problems, such as abnormal amounts of bleeding, swelling, significant bruising, significant pain, or evidence of infection,  please call the office immediately.  FOR ADULT SURGERY PATIENTS: If you need something for pain relief you may take 1 extra strength Tylenol (acetaminophen) AND 2 Ibuprofen (200mg each) together every 4 hours as needed for pain. (do not take these if you are allergic to them or if you have a reason you should not take them.) Typically, you may only need pain medication for 1 to 3 days.    

## 2021-06-28 ENCOUNTER — Telehealth: Payer: Self-pay

## 2021-06-28 NOTE — Telephone Encounter (Signed)
-----   Message from Brendolyn Patty, MD sent at 06/28/2021 11:41 AM EST ----- 1. Skin , left upper nasal dorsum BASAL CELL CARCINOMA, NODULAR PATTERN, CRUSTED 2. Skin , right upper forehead MELANOMA IN SITU, LENTIGO MALIGNA TYPE, LATERAL MARGIN INVOLVED  1. BCC skin cancer, needs Mohs surgery [place] (high 90's% cure rate- line scar) vrs EDC in office (mid 80's% cure rate- round depressed scar) - please call patient  2. Melanoma IS/Lentigo Maligna type (slow-growing within top layer, hasn't spread), recommend Mohs surgery or could also consider topical imiquimod 5% cream to area qhs x 3 months as second line treatment

## 2021-07-03 ENCOUNTER — Telehealth: Payer: Self-pay

## 2021-07-03 NOTE — Telephone Encounter (Signed)
-----   Message from Brendolyn Patty, MD sent at 06/28/2021 11:41 AM EST ----- 1. Skin , left upper nasal dorsum BASAL CELL CARCINOMA, NODULAR PATTERN, CRUSTED 2. Skin , right upper forehead MELANOMA IN SITU, LENTIGO MALIGNA TYPE, LATERAL MARGIN INVOLVED  1. BCC skin cancer, needs Mohs surgery Xavier Foster (high 90's% cure rate- line scar) vrs EDC in office (mid 80's% cure rate- round depressed scar) - please call patient  2. Melanoma IS/Lentigo Maligna type (slow-growing within top layer, hasn't spread), recommend Mohs surgery or could also consider topical imiquimod 5% cream to area qhs x 3 months as second line treatment

## 2021-07-03 NOTE — Telephone Encounter (Signed)
Dr. Nicole Kindred advised daughter of BX results. They would like to do [facility] here for the nose and treat the forehead with imiquimod. Appointment has been scheduled for December the 5th.

## 2021-07-13 IMAGING — CT CT HEAD W/O CM
3 of 4 series · 15 of 47 positions shown, 18 images · non-contrast
Comparison: None.

CLINICAL DATA: Head trauma, emesis, incontinence

EXAM:
CT HEAD WITHOUT CONTRAST
TECHNIQUE: Contiguous axial images were obtained from the base of the skull
through the vertex without intravenous contrast.

[Series 3: head wo · axial · 0.44mm/px · z∈[-140,-15]mm · 9 of 31 slices shown, 12 images]
[im 3/31  brain]
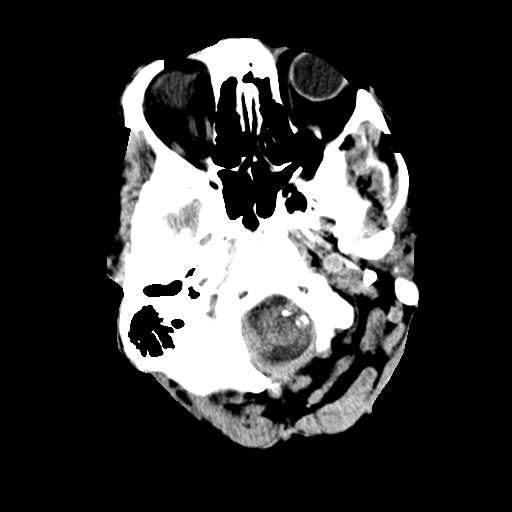
[im 3/31  bone]
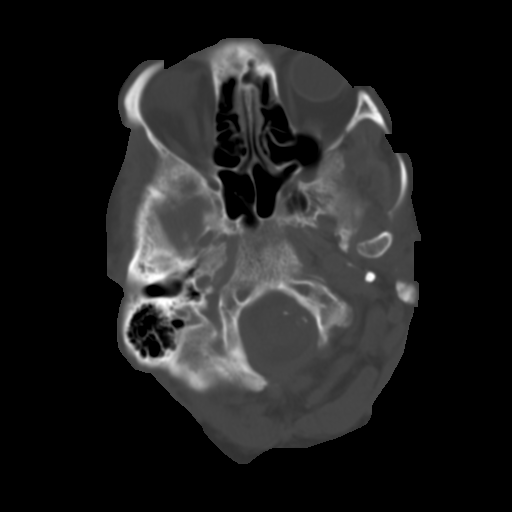
[im 7/31  brain]
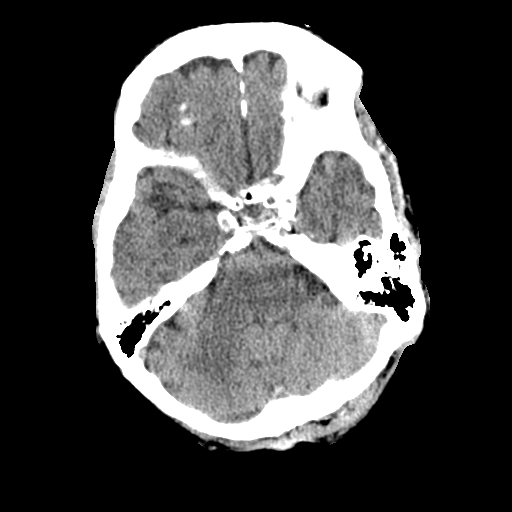
[im 9/31  brain]
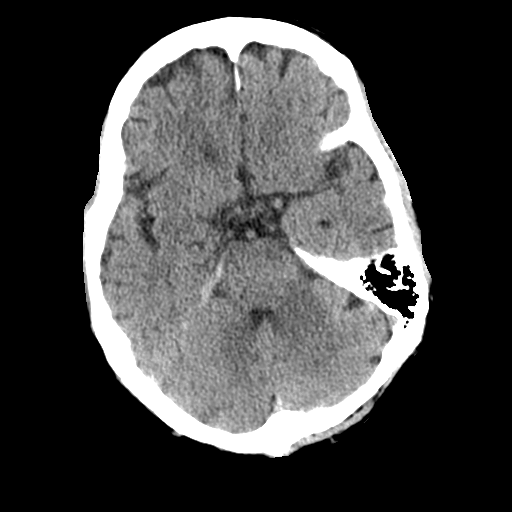
[im 13/31  brain]
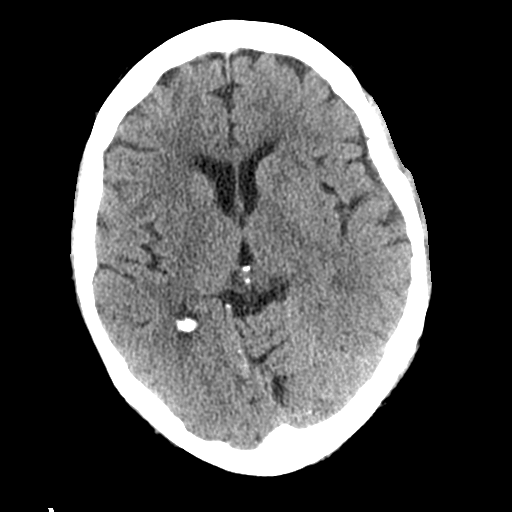
[im 16/31  brain]
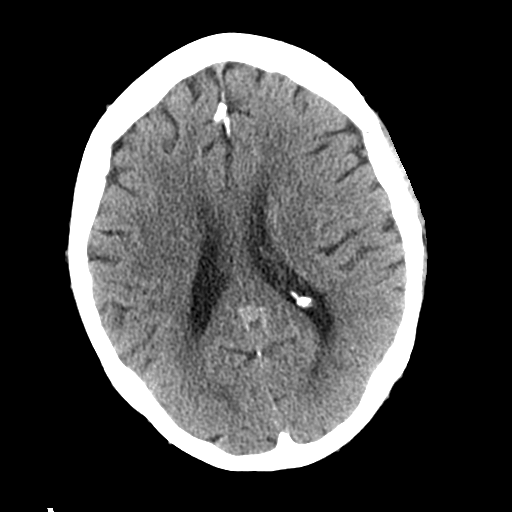
[im 16/31  bone]
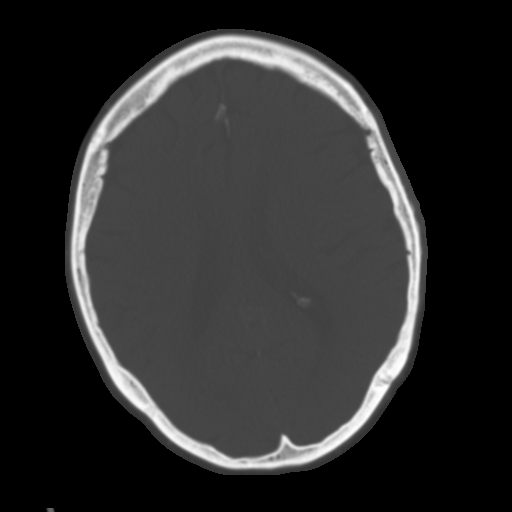
[im 18/31  brain]
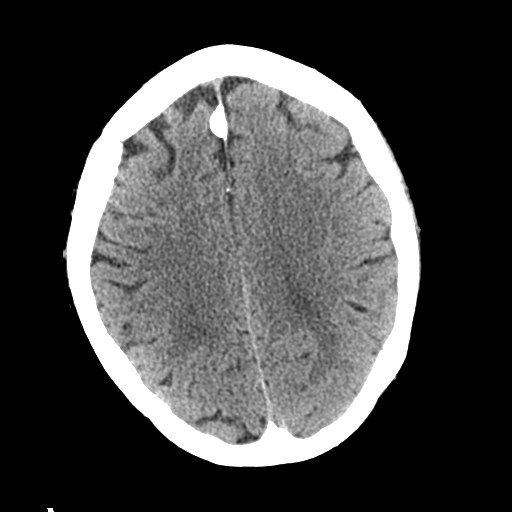
[im 22/31  brain]
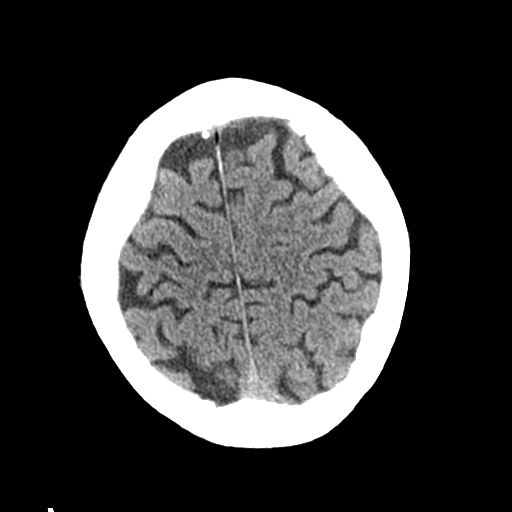
[im 24/31  brain]
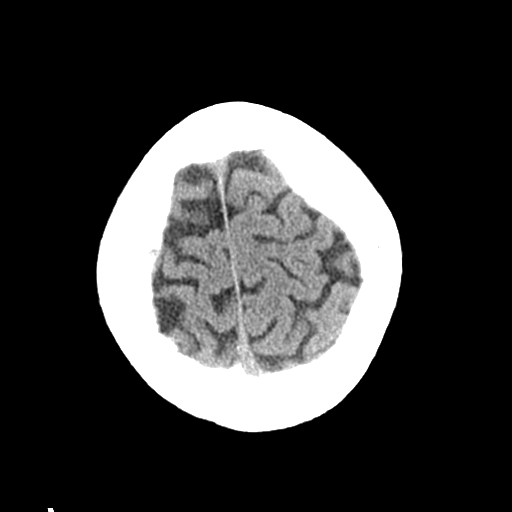
[im 28/31  brain]
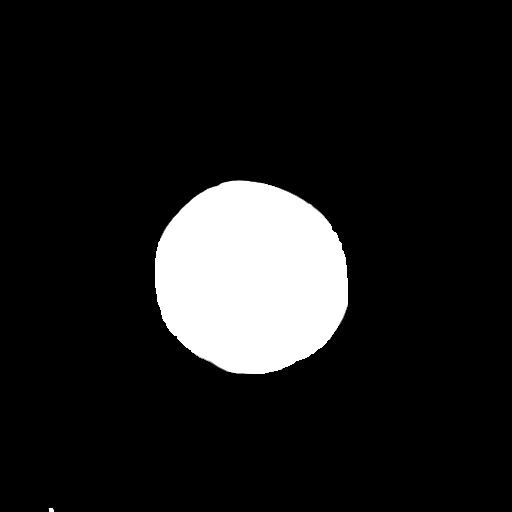
[im 28/31  bone]
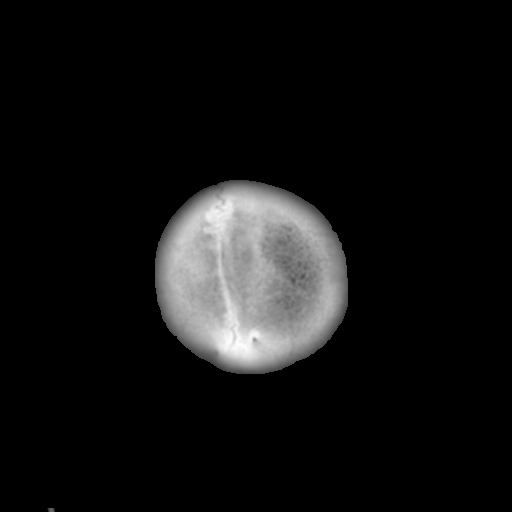

[Series 4: coronal soft tissue · coronal · 0.30mm/px · 3 of 70 slices shown]
[im 24/70  brain]
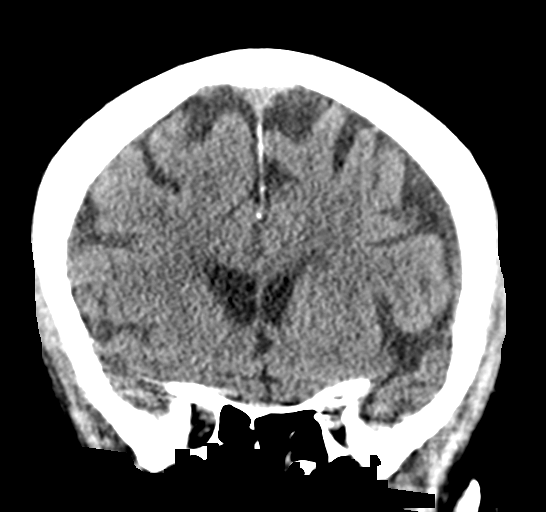
[im 31/70  brain]
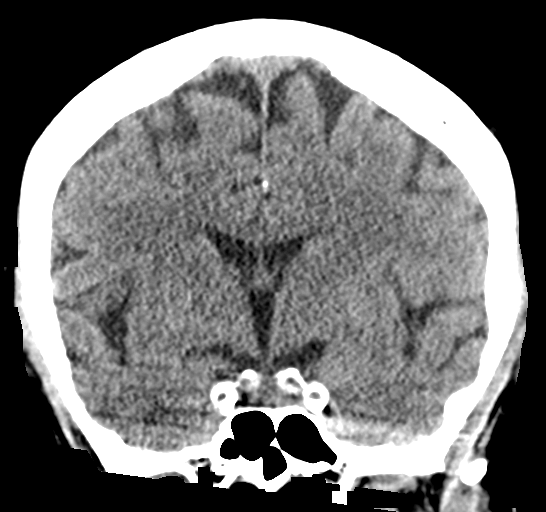
[im 39/70  brain]
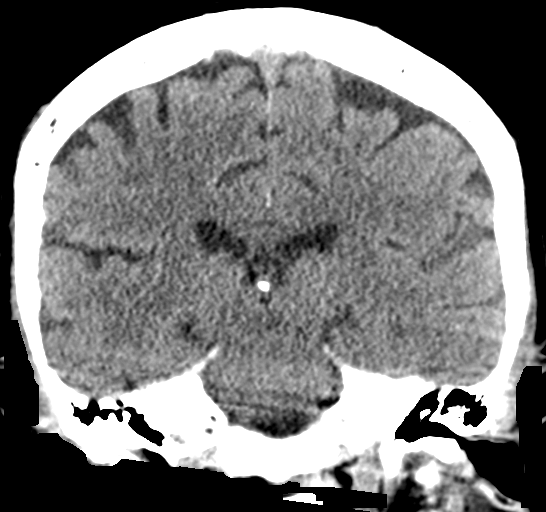

[Series 5: sagittal soft tissue · sagittal · 0.30mm/px · 3 of 55 slices shown]
[im 20/55  brain]
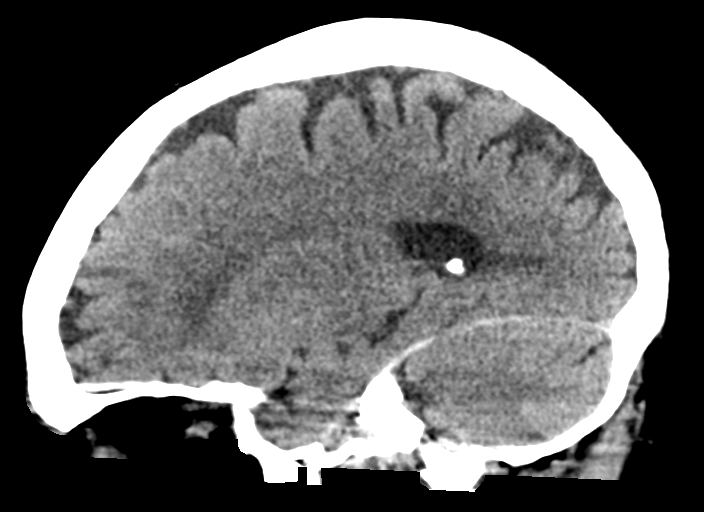
[im 28/55  brain]
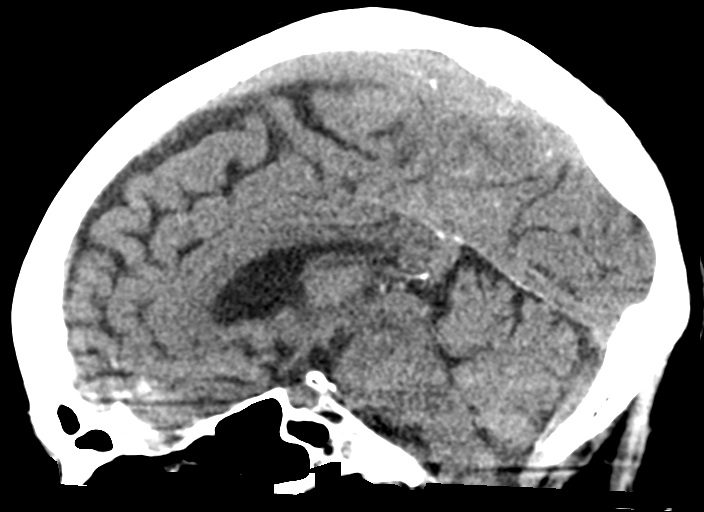
[im 36/55  brain]
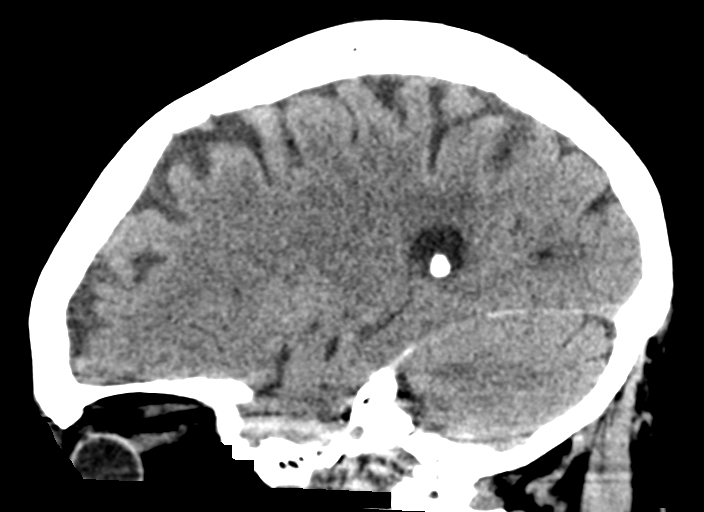

[15 of 47 positions shown; findings below may reference images not displayed]

FINDINGS: Brain: Normal anatomic configuration. Parenchymal volume loss is
commensurate with the patient's age. Mild periventricular white
matter changes are present likely reflecting the sequela of small
vessel ischemia. No abnormal intra or extra-axial mass lesion or
fluid collection. No abnormal mass effect or midline shift. No
evidence of acute intracranial hemorrhage or infarct. Ventricular
size is normal. Cerebellum unremarkable.

Vascular: No asymmetric hyperdense vasculature at the skull base.

Skull: Intact

Sinuses/Orbits: Paranasal sinuses are clear. Orbits are
unremarkable.

Other: Mastoid air cells and middle ear cavities are clear.
IMPRESSION: Mild senescent changes. No acute intracranial abnormality. No
calvarial fracture.

## 2021-07-17 ENCOUNTER — Ambulatory Visit: Payer: Medicare Other | Admitting: Dermatology

## 2021-07-17 ENCOUNTER — Other Ambulatory Visit: Payer: Self-pay

## 2021-07-17 ENCOUNTER — Encounter: Payer: Self-pay | Admitting: Dermatology

## 2021-07-17 DIAGNOSIS — L821 Other seborrheic keratosis: Secondary | ICD-10-CM

## 2021-07-17 DIAGNOSIS — D0339 Melanoma in situ of other parts of face: Secondary | ICD-10-CM | POA: Diagnosis not present

## 2021-07-17 DIAGNOSIS — L570 Actinic keratosis: Secondary | ICD-10-CM | POA: Diagnosis not present

## 2021-07-17 DIAGNOSIS — L409 Psoriasis, unspecified: Secondary | ICD-10-CM

## 2021-07-17 DIAGNOSIS — L578 Other skin changes due to chronic exposure to nonionizing radiation: Secondary | ICD-10-CM | POA: Diagnosis not present

## 2021-07-17 DIAGNOSIS — C44311 Basal cell carcinoma of skin of nose: Secondary | ICD-10-CM

## 2021-07-17 MED ORDER — IMIQUIMOD PUMP 3.75 % EX CREA: TOPICAL_CREAM | CUTANEOUS | 2 refills | Status: DC

## 2021-07-17 MED ORDER — CLOBETASOL PROPIONATE 0.05 % EX CREA: TOPICAL_CREAM | CUTANEOUS | 1 refills | Status: AC

## 2021-07-17 NOTE — Patient Instructions (Addendum)
Cryotherapy Aftercare  Wash gently with soap and water everyday.   Apply Vaseline and Band-Aid daily until healed.    Imiquimod 3.75% Cream - Apply to biopsy site of the right upper forehead every night, as tolerated. May take a break as needed if too irritated.  Once nose (Basal Cell site) is healed from today's treatment, start imiquimod cream every night x 4-6 weeks.  Topical steroids (such as triamcinolone, fluocinolone, fluocinonide, mometasone, clobetasol, halobetasol, betamethasone, hydrocortisone) can cause thinning and lightening of the skin if they are used for too long in the same area. Your physician has selected the right strength medicine for your problem and area affected on the body. Please use your medication only as directed by your physician to prevent side effects.    Recommend Head & Shoulders shampoo - apply to scalp and let sit several minutes before rinsing.   Wound Care Instructions  Cleanse wound gently with soap and water once a day then pat dry with clean gauze. Apply a thing coat of Petrolatum (petroleum jelly, "Vaseline") over the wound (unless you have an allergy to this). We recommend that you use a new, sterile tube of Vaseline. Do not pick or remove scabs. Do not remove the yellow or white "healing tissue" from the base of the wound.  Cover the wound with fresh, clean, nonstick gauze and secure with paper tape. You may use Band-Aids in place of gauze and tape if the would is small enough, but would recommend trimming much of the tape off as there is often too much. Sometimes Band-Aids can irritate the skin.  You should call the office for your biopsy report after 1 week if you have not already been contacted.  If you experience any problems, such as abnormal amounts of bleeding, swelling, significant bruising, significant pain, or evidence of infection, please call the office immediately.  FOR ADULT SURGERY PATIENTS: If you need something for pain relief you  may take 1 extra strength Tylenol (acetaminophen) AND 2 Ibuprofen (200mg  each) together every 4 hours as needed for pain. (do not take these if you are allergic to them or if you have a reason you should not take them.) Typically, you may only need pain medication for 1 to 3 days.

## 2021-07-17 NOTE — Progress Notes (Signed)
Follow-Up Visit   Subjective  Xavier Foster is a 87 y.o. male who presents for the following: Follow-up biopsies.  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 Patient also has a dark spot on his left forearm he would like checked.   Patient accompanied by daughter.   The following portions of the chart were reviewed this encounter and updated as appropriate:       Review of Systems:  No other skin or systemic complaints except as noted in HPI or Assessment and Plan.  Objective  Well appearing patient in no apparent distress; mood and affect are within normal limits.  A focused examination was performed including face. Relevant physical exam findings are noted in the Assessment and Plan.  left upper nasal dorsum Pink biopsy site.  Right Upper Forehead Pink biopsy site. With adjacent but separate light tan macule     Left Elbow, arms Pink scaly plaque; pink scaly macules and papules on the arms.  Mild scale scalp.  R hand dorsum x 1, L hand dorsum x 1, R wrist x 1 (3) Pink scaly papules.   Assessment & Plan  Actinic Damage - chronic, secondary to cumulative UV radiation exposure/sun exposure over time - diffuse scaly erythematous macules with underlying dyspigmentation - Recommend daily broad spectrum sunscreen SPF 30+ to sun-exposed areas, reapply every 2 hours as needed.  - Recommend staying in the shade or wearing long sleeves, sun glasses (UVA+UVB protection) and wide brim hats (4-inch brim around the entire circumference of the hat). - Call for new or changing lesions.  Seborrheic Keratoses - Stuck-on, waxy, tan-brown thin plaque of the left upper wrist - Benign-appearing - Discussed benign etiology and prognosis. - Observe - Call for any changes   Basal cell carcinoma of skin of nose left upper nasal  dorsum  Destruction of lesion  Destruction method: electrodesiccation and curettage   Informed consent: discussed and consent obtained   Timeout:  patient name, date of birth, surgical site, and procedure verified Anesthesia: the lesion was anesthetized in a standard fashion   Anesthetic:  1% lidocaine w/ epinephrine 1-100,000 local infiltration Curettage performed in three different directions: Yes   Electrodesiccation performed over the curetted area: Yes   Final wound size (cm):  1.1 Hemostasis achieved with:  pressure, aluminum chloride and electrodesiccation Outcome: patient tolerated procedure well with no complications   Post-procedure details: wound care instructions given   Post-procedure details comment:  Ointment and bandage applied.  EDC performed today. Once healed, may also use imiquimod 3.75% cream qhs x 4-6 weeks.   Reviewed expected reaction when using imiquimod cream, including irritation and mild inflammation and risk of erosions or more severe inflammation. Reviewed not to apply this in an area larger than 4 x 4 inches to avoid flu-like symptoms. Only a thin layer is required. Reviewed if too much irritation occurs, ensure application of only a thin layer and decrease frequency slightly to achieve a tolerable level of inflammation.     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  Results discussed with patient and daughter. 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  Discussed MOHs surgery vs topical imiquimod cream treatment. Patient prefers to treat topically  due to advanced age..  Start imiquimod 3.75% cream (5% not covered by insurance) Apply to AA right upper forehead qhs as tolerated dsp 7.5g 2Rf. Area will need to be treated for several months, taking breaks as needed.   Reviewed expected reaction when using  imiquimod cream, including irritation and mild inflammation and risk of erosions or more severe inflammation. Reviewed not to apply this in an area larger than 4 x 4 inches to avoid flu-like symptoms. Only a thin layer is required. Reviewed if too much irritation occurs, ensure application of only a thin layer and decrease frequency slightly to achieve a tolerable level of inflammation.     Psoriasis Left Elbow, arms  Possible Psoriasis  Psoriasis is a chronic non-curable, but treatable genetic/hereditary disease that may have other systemic features affecting other organ systems such as joints (Psoriatic Arthritis). It is associated with an increased risk of inflammatory bowel disease, heart disease, non-alcoholic fatty liver disease, and depression.    Start Clobetasol cream Spot treat red, scaly areas on left elbow and arms qd/bid until improved dsp 30g 1Rf.  Recommend Head & Shoulders shampoo - apply to scalp and let sit several minutes before rinsing.   clobetasol cream (TEMOVATE) 0.05 % - Left Elbow, arms Apply to pink, scaly spots on arms and elbow 1-2 times a day until improved. Avoid face, groin, axilla.  Hypertrophic actinic keratosis (3) R hand dorsum x 1, L hand dorsum x 1, R wrist x 1  Actinic keratoses are precancerous spots that appear secondary to cumulative UV radiation exposure/sun exposure over time. They are chronic with expected duration over 1 year. A portion of actinic keratoses will progress to squamous cell carcinoma of the skin. It is not possible to reliably predict which spots will progress to skin cancer and so treatment is recommended to prevent development of skin cancer.  Recommend daily broad spectrum sunscreen SPF 30+ to sun-exposed areas, reapply every 2 hours as needed.  Recommend staying in the shade or wearing long sleeves, sun glasses (UVA+UVB protection) and wide brim hats (4-inch brim around the entire circumference of the hat). Call for new or  changing lesions.  Destruction of lesion - R hand dorsum x 1, L hand dorsum x 1, R wrist x 1  Destruction method: cryotherapy   Informed consent: discussed and consent obtained   Lesion destroyed using liquid nitrogen: Yes   Region frozen until ice ball extended beyond lesion: Yes   Outcome: patient tolerated procedure well with no complications   Post-procedure details: wound care instructions given   Additional details:  Prior to procedure, discussed risks of blister formation, small wound, skin dyspigmentation, or rare scar following cryotherapy. Recommend Vaseline ointment to treated areas while healing.   Return in about 3 months (around 10/15/2021) for recheck melanoma and BCC sites and psoriasis.  IJamesetta Orleans, CMA, am acting as scribe for Brendolyn Patty, MD . Documentation: I have reviewed the above documentation for accuracy and completeness, and I agree with the above.  Brendolyn Patty MD

## 2021-07-25 ENCOUNTER — Telehealth: Payer: Self-pay

## 2021-07-25 MED ORDER — IMIQUIMOD 5 % EX CREA: TOPICAL_CREAM | Freq: Every day | CUTANEOUS | 0 refills | Status: AC

## 2021-07-25 NOTE — Telephone Encounter (Signed)
RX sent in. Aw  Called daughter and advised of her strength change.

## 2021-07-25 NOTE — Telephone Encounter (Signed)
Patient's Imiquimod Cream has been denied by [employer].

## 2021-07-25 NOTE — Telephone Encounter (Signed)
I can draft a appeal letter, it states it is not being used for "medically accepted indication". Or I can send in the 5%, what would the directions be?

## 2021-10-16 ENCOUNTER — Other Ambulatory Visit: Payer: Self-pay

## 2021-10-16 ENCOUNTER — Ambulatory Visit: Payer: Medicare Other | Admitting: Dermatology

## 2021-10-16 DIAGNOSIS — D485 Neoplasm of uncertain behavior of skin: Secondary | ICD-10-CM

## 2021-10-16 DIAGNOSIS — C44311 Basal cell carcinoma of skin of nose: Secondary | ICD-10-CM | POA: Diagnosis not present

## 2021-10-16 DIAGNOSIS — D0339 Melanoma in situ of other parts of face: Secondary | ICD-10-CM

## 2021-10-16 DIAGNOSIS — L409 Psoriasis, unspecified: Secondary | ICD-10-CM

## 2021-10-16 NOTE — Patient Instructions (Addendum)

## 2021-10-16 NOTE — Progress Notes (Signed)
? ?Follow-Up Visit ?  ?Subjective  ?Xavier Foster is a 88 y.o. male who presents for the following: Follow-up. ? ?Patient here for 3 month follow-up. Recheck melanoma in situ, biopsy proven, of the right upper forehead. He has treated with imiquimod 5% cream off and on and area scabbed and got crusty each time. Also recheck left nasal dorsum, history of BCC, treated with EDC. He has only used imiquimod on this area of nose a few times. Area gets irritated and rubbed by glasses. Psoriasis of the left elbow and arms treated with clobetasol cream, improving.  ? ?Patient accompanied by daughter.  ? ?The following portions of the chart were reviewed this encounter and updated as appropriate:  ?  ?  ? ?Review of Systems:  No other skin or systemic complaints except as noted in HPI or Assessment and Plan. ? ?Objective  ?Well appearing patient in no apparent distress; mood and affect are within normal limits. ? ?A focused examination was performed including face, arms. Relevant physical exam findings are noted in the Assessment and Plan. ? ?L upper nasal dorsum ?8.0 x 4.0 mm pink patch sup med to scar ? ? ? ? ? ? ?R upper forehead ?Clear today ? ? ? ? ?left elbow ?Pink scaly papules ? ? ? ?Assessment & Plan  ?Neoplasm of uncertain behavior of skin ?L upper nasal dorsum ? ?Skin / nail biopsy ?Type of biopsy: tangential   ?Informed consent: discussed and consent obtained   ?Patient was prepped and draped in usual sterile fashion: Area prepped with alcohol. ?Anesthesia: the lesion was anesthetized in a standard fashion   ?Anesthetic:  1% lidocaine w/ epinephrine 1-100,000 buffered w/ 8.4% NaHCO3 ?Instrument used: flexible razor blade   ?Hemostasis achieved with: pressure, aluminum chloride and electrodesiccation   ?Outcome: patient tolerated procedure well   ?Post-procedure details: wound care instructions given   ?Post-procedure details comment:  Ointment and small bandage applied ? ?Specimen 1 - Surgical  pathology ?Differential Diagnosis: r/o Recurrent BCC ?Check Margins: No ?8.0 x 4.0 mm pink patch sup med to scar ?[id number] ? ?If positive for Recurrent BCC, discussed Mohs vs Radiation. ? ?Melanoma in situ of other parts of face [facility]) ?R upper forehead ? ?Biopsy proven. S/p imiquimod treatment. Appears clear today. No evidence of recurrence. Photo today. ? ?Observation.  ? ? ?Psoriasis ?left elbow ? ?Chronic and persistent condition with duration or expected duration over one year. Improving but not currently at goal.  ? ?Psoriasis is a chronic non-curable, but treatable genetic/hereditary disease that may have other systemic features affecting other organ systems such as joints (Psoriatic Arthritis). It is associated with an increased risk of inflammatory bowel disease, heart disease, non-alcoholic fatty liver disease, and depression.   ? ?Continue clobetasol cream spot tx AA L elbow qd/bid prn flares. Avoid face, groin, axilla.  ? ?Topical steroids (such as triamcinolone, fluocinolone, fluocinonide, mometasone, clobetasol, halobetasol, betamethasone, hydrocortisone) can cause thinning and lightening of the skin if they are used for too long in the same area. Your physician has selected the right strength medicine for your problem and area affected on the body. Please use your medication only as directed by your physician to prevent side effects.  ? ? ?Related Medications ?clobetasol cream (TEMOVATE) 0.05 % ?Apply to pink, scaly spots on arms and elbow 1-2 times a day until improved. Avoid face, groin, axilla. ? ? ?Return in about 6 months (around 04/18/2022) for Hx melanoma IS. ? ?Documentation: I have reviewed the above documentation for  accuracy and completeness, and I agree with the above. ? ?Brendolyn Patty MD  ?

## 2021-10-19 ENCOUNTER — Telehealth: Payer: Self-pay

## 2021-10-19 NOTE — Telephone Encounter (Signed)
Patient's daughter advised of BX results. She will speak with her brother and father regarding treatment options and call back to scheduled. aw ?

## 2021-10-19 NOTE — Telephone Encounter (Signed)
-----   Message from Brendolyn Patty, MD sent at 10/18/2021  7:41 PM EST ----- ?Skin , left upper nasal dorsum ?BASAL CELL CARCINOMA, NODULAR PATTERN, BASE INVOLVED ? ?Recurrent BCC skin cancer, recommend Mohs surgery [facility]) or radiation [facility]) - please call patient ?

## 2021-10-19 NOTE — Telephone Encounter (Signed)
Left pt's daughter message to call for pts bx results./sh ?

## 2021-10-23 ENCOUNTER — Other Ambulatory Visit: Payer: Self-pay

## 2021-10-23 DIAGNOSIS — C44311 Basal cell carcinoma of skin of nose: Secondary | ICD-10-CM

## 2021-10-23 NOTE — Progress Notes (Signed)
Referral sent to [facility]./sh ?

## 2021-10-23 NOTE — Progress Notes (Signed)
Pt daughter called and requested we send in referral for Mohs surgery of BCC,  ? ?I ordered referral for [place] [facility], sonya will complete this referral ?

## 2021-12-28 ENCOUNTER — Telehealth: Payer: Self-pay

## 2021-12-28 NOTE — Telephone Encounter (Signed)
Updating specimen tracking and history from [facility] progress notes, scanned into history. aw

## 2022-05-01 ENCOUNTER — Ambulatory Visit: Payer: Medicare Other | Admitting: Dermatology

## 2022-05-01 DIAGNOSIS — L578 Other skin changes due to chronic exposure to nonionizing radiation: Secondary | ICD-10-CM

## 2022-05-01 DIAGNOSIS — C44629 Squamous cell carcinoma of skin of left upper limb, including shoulder: Secondary | ICD-10-CM

## 2022-05-01 DIAGNOSIS — Z1283 Encounter for screening for malignant neoplasm of skin: Secondary | ICD-10-CM | POA: Diagnosis not present

## 2022-05-01 DIAGNOSIS — Z85828 Personal history of other malignant neoplasm of skin: Secondary | ICD-10-CM

## 2022-05-01 DIAGNOSIS — D2239 Melanocytic nevi of other parts of face: Secondary | ICD-10-CM

## 2022-05-01 DIAGNOSIS — D692 Other nonthrombocytopenic purpura: Secondary | ICD-10-CM

## 2022-05-01 DIAGNOSIS — D489 Neoplasm of uncertain behavior, unspecified: Secondary | ICD-10-CM

## 2022-05-01 DIAGNOSIS — C4492 Squamous cell carcinoma of skin, unspecified: Secondary | ICD-10-CM

## 2022-05-01 DIAGNOSIS — D1801 Hemangioma of skin and subcutaneous tissue: Secondary | ICD-10-CM

## 2022-05-01 DIAGNOSIS — L820 Inflamed seborrheic keratosis: Secondary | ICD-10-CM

## 2022-05-01 DIAGNOSIS — L821 Other seborrheic keratosis: Secondary | ICD-10-CM

## 2022-05-01 DIAGNOSIS — Z86006 Personal history of melanoma in-situ: Secondary | ICD-10-CM | POA: Diagnosis not present

## 2022-05-01 DIAGNOSIS — D229 Melanocytic nevi, unspecified: Secondary | ICD-10-CM

## 2022-05-01 DIAGNOSIS — L814 Other melanin hyperpigmentation: Secondary | ICD-10-CM

## 2022-05-01 HISTORY — DX: Squamous cell carcinoma of skin, unspecified: C44.92

## 2022-05-01 NOTE — Patient Instructions (Addendum)
Discontinue clobetasol cream at arms  Recommend starting moisturizer with exfoliant (Urea, Salicylic acid, or Lactic acid) one to two times daily to help smooth rough and bumpy skin.  OTC options include Cetaphil Rough and Bumpy lotion (Urea), Eucerin Roughness Relief lotion or spot treatment cream (Urea), CeraVe SA lotion/cream for Rough and Bumpy skin (Sal Acid), Gold Bond Rough and Bumpy cream (Sal Acid), and AmLactin 12% lotion/cream (Lactic Acid).  If applying in morning, also apply sunscreen to sun-exposed areas, since these exfoliating moisturizers can increase sensitivity to sun.     Electrodesiccation and Curettage ("Scrape and Burn") Wound Care Instructions  Leave the original bandage on for 24 hours if possible.  If the bandage becomes soaked or soiled before that time, it is OK to remove it and examine the wound.  A small amount of post-operative bleeding is normal.  If excessive bleeding occurs, remove the bandage, place gauze over the site and apply continuous pressure (no peeking) over the area for 30 minutes. If this does not work, please call our clinic as soon as possible or page your doctor if it is after hours.   Once a day, cleanse the wound with soap and water. It is fine to shower. If a thick crust develops you may use a Q-tip dipped into dilute hydrogen peroxide (mix 1:1 with water) to dissolve it.  Hydrogen peroxide can slow the healing process, so use it only as needed.    After washing, apply petroleum jelly (Vaseline) or an antibiotic ointment if your doctor prescribed one for you, followed by a bandage.    For best healing, the wound should be covered with a layer of ointment at all times. If you are not able to keep the area covered with a bandage to hold the ointment in place, this may mean re-applying the ointment several times a day.  Continue this wound care until the wound has healed and is no longer open. It may take several weeks for the wound to heal and  close.  Itching and mild discomfort is normal during the healing process.  If you have any discomfort, you can take Tylenol (acetaminophen) or ibuprofen as directed on the bottle. (Please do not take these if you have an allergy to them or cannot take them for another reason).  Some redness, tenderness and white or yellow material in the wound is normal healing.  If the area becomes very sore and red, or develops a thick yellow-green material (pus), it may be infected; please notify us.    Wound healing continues for up to one year following surgery. It is not unusual to experience pain in the scar from time to time during the interval.  If the pain becomes severe or the scar thickens, you should notify the office.    A slight amount of redness in a scar is expected for the first six months.  After six months, the redness will fade and the scar will soften and fade.  The color difference becomes less noticeable with time.  If there are any problems, return for a post-op surgery check at your earliest convenience.  To improve the appearance of the scar, you can use silicone scar gel, cream, or sheets (such as Mederma or Serica) every night for up to one year. These are available over the counter (without a prescription).  Please call our office at [phone] for any questions or concerns.     Biopsy Wound Care Instructions  Leave the original bandage on for  24 hours if possible.  If the bandage becomes soaked or soiled before that time, it is OK to remove it and examine the wound.  A small amount of post-operative bleeding is normal.  If excessive bleeding occurs, remove the bandage, place gauze over the site and apply continuous pressure (no peeking) over the area for 30 minutes. If this does not work, please call our clinic as soon as possible or page your doctor if it is after hours.   Once a day, cleanse the wound with soap and water. It is fine to shower. If a thick crust develops you  may use a Q-tip dipped into dilute hydrogen peroxide (mix 1:1 with water) to dissolve it.  Hydrogen peroxide can slow the healing process, so use it only as needed.    After washing, apply petroleum jelly (Vaseline) or an antibiotic ointment if your doctor prescribed one for you, followed by a bandage.    For best healing, the wound should be covered with a layer of ointment at all times. If you are not able to keep the area covered with a bandage to hold the ointment in place, this may mean re-applying the ointment several times a day.  Continue this wound care until the wound has healed and is no longer open.   Itching and mild discomfort is normal during the healing process. However, if you develop pain or severe itching, please call our office.   If you have any discomfort, you can take Tylenol (acetaminophen) or ibuprofen as directed on the bottle. (Please do not take these if you have an allergy to them or cannot take them for another reason).  Some redness, tenderness and white or yellow material in the wound is normal healing.  If the area becomes very sore and red, or develops a thick yellow-green material (pus), it may be infected; please notify us.    If you have stitches, return to clinic as directed to have the stitches removed. You will continue wound care for 2-3 days after the stitches are removed.   Wound healing continues for up to one year following surgery. It is not unusual to experience pain in the scar from time to time during the interval.  If the pain becomes severe or the scar thickens, you should notify the office.    A slight amount of redness in a scar is expected for the first six months.  After six months, the redness will fade and the scar will soften and fade.  The color difference becomes less noticeable with time.  If there are any problems, return for a post-op surgery check at your earliest convenience.  To improve the appearance of the scar, you can use  silicone scar gel, cream, or sheets (such as Mederma or Serica) every night for up to one year. These are available over the counter (without a prescription).  Please call our office at [phone] for any questions or concerns.        Cryotherapy Aftercare  Wash gently with soap and water everyday.   Apply Vaseline and Band-Aid daily until healed.      Seborrheic Keratosis  What causes seborrheic keratoses? Seborrheic keratoses are harmless, common skin growths that first appear during adult life.  As time goes by, more growths appear.  Some people may develop a large number of them.  Seborrheic keratoses appear on both covered and uncovered body parts.  They are not caused by sunlight.  The tendency to develop seborrheic keratoses can  be inherited.  They vary in color from skin-colored to gray, brown, or even black.  They can be either smooth or have a rough, warty surface.   Seborrheic keratoses are superficial and look as if they were stuck on the skin.  Under the microscope this type of keratosis looks like layers upon layers of skin.  That is why at times the top layer may seem to fall off, but the rest of the growth remains and re-grows.    Treatment Seborrheic keratoses do not need to be treated, but can easily be removed in the office.  Seborrheic keratoses often cause symptoms when they rub on clothing or jewelry.  Lesions can be in the way of shaving.  If they become inflamed, they can cause itching, soreness, or burning.  Removal of a seborrheic keratosis can be accomplished by freezing, burning, or surgery. If any spot bleeds, scabs, or grows rapidly, please return to have it checked, as these can be an indication of a skin cancer.      Melanoma ABCDEs  Melanoma is the most dangerous type of skin cancer, and is the leading cause of death from skin disease.  You are more likely to develop melanoma if you: Have light-colored skin, light-colored eyes, or red or blond  hair Spend a lot of time in the sun Tan regularly, either outdoors or in a tanning bed Have had blistering sunburns, especially during childhood Have a close family member who has had a melanoma Have atypical moles or large birthmarks  Early detection of melanoma is key since treatment is typically straightforward and cure rates are extremely high if we catch it early.   The first sign of melanoma is often a change in a mole or a new dark spot.  The ABCDE system is a way of remembering the signs of melanoma.  A for asymmetry:  The two halves do not match. B for border:  The edges of the growth are irregular. C for color:  A mixture of colors are present instead of an even brown color. D for diameter:  Melanomas are usually (but not always) greater than 67m - the size of a pencil eraser. E for evolution:  The spot keeps changing in size, shape, and color.  Please check your skin once per month between visits. You can use a small mirror in front and a large mirror behind you to keep an eye on the back side or your body.   If you see any new or changing lesions before your next follow-up, please call to schedule a visit.  Please continue daily skin protection including broad spectrum sunscreen SPF 30+ to sun-exposed areas, reapplying every 2 hours as needed when you're outdoors.   Staying in the shade or wearing long sleeves, sun glasses (UVA+UVB protection) and wide brim hats (4-inch brim around the entire circumference of the hat) are also recommended for sun protection.    Due to recent changes in healthcare laws, you may see results of your pathology and/or laboratory studies on MyChart before the doctors have had a chance to review them. We understand that in some cases there may be results that are confusing or concerning to you. Please understand that not all results are received at the same time and often the doctors may need to interpret multiple results in order to provide you with  the best plan of care or course of treatment. Therefore, we ask that you please give u[place]2 business days to thoroughly review  all your results before contacting the office for clarification. Should we see a critical lab result, you will be contacted sooner.   If You Need Anything After Your Visit  If you have any questions or concerns for your doctor, please call our main line at [phone] and press option 4 to reach your doctor's medical assistant. If no one answers, please leave a voicemail as directed and we will return your call as soon as possible. Messages left after 4 pm will be answered the following business day.   You may also send [place] a message via [place]. We typically respond to MyChart messages within 1-2 business days.  For prescription refills, please ask your pharmacy to contact our office. Our fax number is [phone].  If you have an urgent issue when the clinic is closed that cannot wait until the next business day, you can page your doctor at the number below.    Please note that while we do our best to be available for urgent issues outside of office hours, we are not available 24/7.   If you have an urgent issue and are unable to reach [place], you may choose to seek medical care at your doctor's office, retail clinic, urgent care center, or emergency room.  If you have a medical emergency, please immediately call 911 or go to the emergency department.  Pager Numbers  - Dr. Nehemiah Massed: [phone]  - Dr. Laurence Ferrari: [phone]  - Dr. Nicole Kindred: [phone]  In the event of inclement weather, please call our main line at [phone] for an update on the status of any delays or closures.  Dermatology Medication Tips: Please keep the boxes that topical medications come in in order to help keep track of the instructions about where and how to use these. Pharmacies typically print the medication instructions only on the boxes and not directly on the medication tubes.   If  your medication is too expensive, please contact our office at [phone] option 4 or send [place] a message through [place].   We are unable to tell what your co-pay for medications will be in advance as this is different depending on your insurance coverage. However, we may be able to find a substitute medication at lower cost or fill out paperwork to get insurance to cover a needed medication.   If a prior authorization is required to get your medication covered by your insurance company, please allow [place] 1-2 business days to complete this process.  Drug prices often vary depending on where the prescription is filled and some pharmacies may offer cheaper prices.  The website www.goodrx.com contains coupons for medications through different pharmacies. The prices here do not account for what the cost may be with help from insurance (it may be cheaper with your insurance), but the website can give you the price if you did not use any insurance.  - You can print the associated coupon and take it with your prescription to the pharmacy.  - You may also stop by our office during regular business hours and pick up a GoodRx coupon card.  - If you need your prescription sent electronically to a different pharmacy, notify our office through [facility] or by phone at [phone] option 4.     Si Usted Necesita Algo Despus de Su Visita  Tambin puede enviarnos un mensaje a travs de Pharmacist, community. Por lo general respondemos a los mensajes de MyChart en el transcurso de 1 a 2 das hbiles.  Para renovar recetas, por  favor pida a su farmacia que se ponga en contacto con nuestra oficina. Harland Dingwall de fax es [place] [phone].  Si tiene un asunto urgente cuando la clnica est cerrada y que no puede esperar hasta el siguiente da hbil, puede llamar/localizar a su doctor(a) al nmero que aparece a continuacin.   Por favor, tenga en cuenta que aunque hacemos todo lo posible para estar disponibles para  asuntos urgentes fuera del horario de [place], no estamos disponibles las 24 horas del da, los 7 das de la [place].   Si tiene un problema urgente y no puede comunicarse con nosotros, puede optar por buscar atencin mdica  en el consultorio de su doctor(a), en una clnica privada, en un centro de atencin urgente o en una sala de emergencias.  Si tiene Engineering geologist, por favor llame inmediatamente al 911 o vaya a la sala de emergencias.  Nmeros de bper  - Dr. Nehemiah Massed: [phone]  - Dra. Moye: [phone]  - Dra. Nicole Kindred: [phone]  En caso de inclemencias del [place], por favor llame a Johnsie Kindred principal al [phone] para una actualizacin sobre el [place] de cualquier retraso o cierre.  Consejos para la medicacin en dermatologa: Por favor, guarde las cajas en las que vienen los medicamentos de uso tpico para ayudarle a seguir las instrucciones sobre dnde y cmo usarlos. Las farmacias generalmente imprimen las instrucciones del medicamento slo en las cajas y no directamente en los tubos del [place].   Si su medicamento es muy caro, por favor, pngase en contacto con Zigmund Daniel llamando al [phone] y presione la opcin 4 o envenos un mensaje a travs de Pharmacist, community.   No podemos decirle cul ser su copago por los medicamentos por adelantado ya que esto es diferente dependiendo de la cobertura de su seguro. Sin embargo, es posible que podamos encontrar un medicamento sustituto a Electrical engineer un formulario para que el seguro cubra el medicamento que se considera necesario.   Si se requiere una autorizacin previa para que su compaa de seguros [place] su medicamento, por favor permtanos de 1 a 2 das hbiles para completar este proceso.  Los precios de los medicamentos varan con frecuencia dependiendo del Environmental consultant de dnde se surte la receta y alguna farmacias pueden ofrecer precios ms baratos.  El sitio web www.goodrx.com tiene cupones para  medicamentos de Airline pilot. Los precios aqu no tienen en cuenta lo que podra costar con la ayuda del seguro (puede ser ms barato con su seguro), pero el sitio web puede darle el precio si no utiliz Research scientist (physical sciences).  - Puede imprimir el cupn correspondiente y llevarlo con su receta a la farmacia.  - Tambin puede pasar por nuestra oficina durante el horario de atencin regular y Charity fundraiser una tarjeta de cupones de GoodRx.  - Si necesita que su receta se enve electrnicamente a una farmacia diferente, informe a nuestra oficina a travs de MyChart de [employer] o por telfono llamando al [phone] y presione la opcin 4.

## 2022-05-01 NOTE — Progress Notes (Signed)
Follow-Up Visit   Subjective  Xavier Foster is a 88 y.o. male who presents for the following: Annual Exam (6 month ubse. Hx of melanoma, hx of bcc, hx of psoriasis left elbow clobetasol cream, hx of isks, hx of aks. /Reports itchy spots at back and arms. ).  The patient presents for Total-Body Skin Exam (TBSE) for skin cancer screening and mole check.  The patient has spots, moles and lesions to be evaluated, some may be new or changing and the patient has concerns that these could be cancer.    The following portions of the chart were reviewed this encounter and updated as appropriate:      Review of Systems: No other skin or systemic complaints except as noted in HPI or Assessment and Plan.   Objective  Well appearing patient in no apparent distress; mood and affect are within normal limits.  All skin waist up examined.  right nasal alar 0.6 mm flesh papule, pt denies bleeding, scale  right lower back x 1, left upper arm x 3, right scapula x 1, right upper arm x 1, right mid back x 1, left elbow x 2 (9) Erythematous stuck-on, waxy papule or plaque  left upper arm below shoulder 9 mm keratotic nodule         Assessment & Plan  Nevus right nasal alar  Benign-appearing.  Observation.  Call clinic for new or changing lesions.  Recommend daily use of broad spectrum spf 30+ sunscreen to sun-exposed areas.    Recheck at follow up  Inflamed seborrheic keratosis (9) right lower back x 1, left upper arm x 3, right scapula x 1, right upper arm x 1, right mid back x 1, left elbow x 2  Symptomatic, irritating, patient would like treated.  Destruction of lesion - right lower back x 1, left upper arm x 3, right scapula x 1, right upper arm x 1, right mid back x 1, left elbow x 2  Destruction method: cryotherapy   Informed consent: discussed and consent obtained   Lesion destroyed using liquid nitrogen: Yes   Region frozen until ice ball extended beyond lesion: Yes    Outcome: patient tolerated procedure well with no complications   Post-procedure details: wound care instructions given   Additional details:  Prior to procedure, discussed risks of blister formation, small wound, skin dyspigmentation, or rare scar following cryotherapy. Recommend Vaseline ointment to treated areas while healing.   Neoplasm of uncertain behavior left upper arm below shoulder  Epidermal / dermal shaving  Lesion diameter (cm):  0.9 Informed consent: discussed and consent obtained   Timeout: patient name, date of birth, surgical site, and procedure verified   Procedure prep:  Patient was prepped and draped in usual sterile fashion Prep type:  Isopropyl alcohol Anesthesia: the lesion was anesthetized in a standard fashion   Anesthetic:  1% lidocaine w/ epinephrine 1-100,000 buffered w/ 8.4% NaHCO3 Instrument used: flexible razor blade   Hemostasis achieved with: pressure, aluminum chloride and electrodesiccation   Outcome: patient tolerated procedure well    Destruction of lesion  Destruction method: electrodesiccation and curettage   Informed consent: discussed and consent obtained   Timeout:  patient name, date of birth, surgical site, and procedure verified Curettage performed in three different directions: Yes   Electrodesiccation performed over the curetted area: Yes   Final wound size (cm):  1.2 Hemostasis achieved with:  pressure, aluminum chloride and electrodesiccation Outcome: patient tolerated procedure well with no complications   Post-procedure  details: wound care instructions given   Additional details:  Mupirocin ointment and Bandaid applied   Specimen 1 - Surgical pathology Differential Diagnosis: R/o hypertrophic ak vs scc   Check Margins: No  R/o hypertrophic ak vs scc   Lentigines - Scattered tan macules - Due to sun exposure - Benign-appearing, observe - Recommend daily broad spectrum sunscreen SPF 30+ to sun-exposed areas, reapply every  2 hours as needed. - Call for any changes  Seborrheic Keratoses At left cheek - Stuck-on, waxy, tan-brown papules and/or plaques  - Benign-appearing - Discussed benign etiology and prognosis. - Observe - Call for any changes  Purpura - Chronic; persistent and recurrent.  Treatable, but not curable. - Violaceous macules and patches - Benign - Related to trauma, age, sun damage and/or use of blood thinners, chronic use of topical and/or oral steroids - Observe - Can use OTC arnica containing moisturizer such as Dermend Bruise Formula if desired - Call for worsening or other concerns  Melanocytic Nevi - Tan-brown and/or pink-flesh-colored symmetric macules and papules - Benign appearing on exam today - Observation - Call clinic for new or changing moles - Recommend daily use of broad spectrum spf 30+ sunscreen to sun-exposed areas.   Hemangiomas - Red papules - Discussed benign nature - Observe - Call for any changes  Actinic Damage - Chronic condition, secondary to cumulative UV/sun exposure - diffuse scaly erythematous macules with underlying dyspigmentation - Recommend daily broad spectrum sunscreen SPF 30+ to sun-exposed areas, reapply every 2 hours as needed.  - Staying in the shade or wearing long sleeves, sun glasses (UVA+UVB protection) and wide brim hats (4-inch brim around the entire circumference of the hat) are also recommended for sun protection.  - Call for new or changing lesions.  History of Basal Cell Carcinoma of the Skin  Left upper nasal dorsum nodular pattern  10/2021 [facility] surgery  - No evidence of recurrence today - Recommend regular full body skin exams - Recommend daily broad spectrum sunscreen SPF 30+ to sun-exposed areas, reapply every 2 hours as needed.  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  - Call if any new or changing lesions are noted between office visits  Skin cancer screening performed today. Return for 3 - 4  month hx of melanoma in situ and bcc .  I, Ruthell Rummage, CMA, am acting as scribe for Brendolyn Patty, MD.  Documentation: I have reviewed the above documentation for accuracy and completeness, and I agree with the above.  Brendolyn Patty MD

## 2022-05-07 ENCOUNTER — Telehealth: Payer: Self-pay

## 2022-05-07 NOTE — Telephone Encounter (Signed)
-----   Message from Brendolyn Patty, MD sent at 05/07/2022  9:30 AM EDT ----- Skin , left upper arm below shoulder WELL DIFFERENTIATED SQUAMOUS CELL CARCINOMA   SCC skin cancer- already treated with EDC at time of biopsy   - please call patient

## 2022-05-07 NOTE — Telephone Encounter (Signed)
Left pt msg to call for bx results/sh 

## 2022-05-08 NOTE — Telephone Encounter (Signed)
Lft pt another message to call for bx result/sh

## 2022-05-09 ENCOUNTER — Telehealth: Payer: Self-pay

## 2022-05-09 NOTE — Telephone Encounter (Signed)
-----   Message from Brendolyn Patty, MD sent at 05/07/2022  9:30 AM EDT ----- Skin , left upper arm below shoulder WELL DIFFERENTIATED SQUAMOUS CELL CARCINOMA   SCC skin cancer- already treated with EDC at time of biopsy   - please call patient

## 2022-05-09 NOTE — Telephone Encounter (Signed)
Patients daughter Jeannene Patella was notified of patients bx results/ AF

## 2022-05-23 IMAGING — DX DG CHEST 1V PORT
1 series · 1 of 1 positions shown · non-contrast
Comparison: July 25, 2020

CLINICAL DATA: covid +, productive cough

EXAM:
PORTABLE CHEST 1 VIEW

[chest ap]
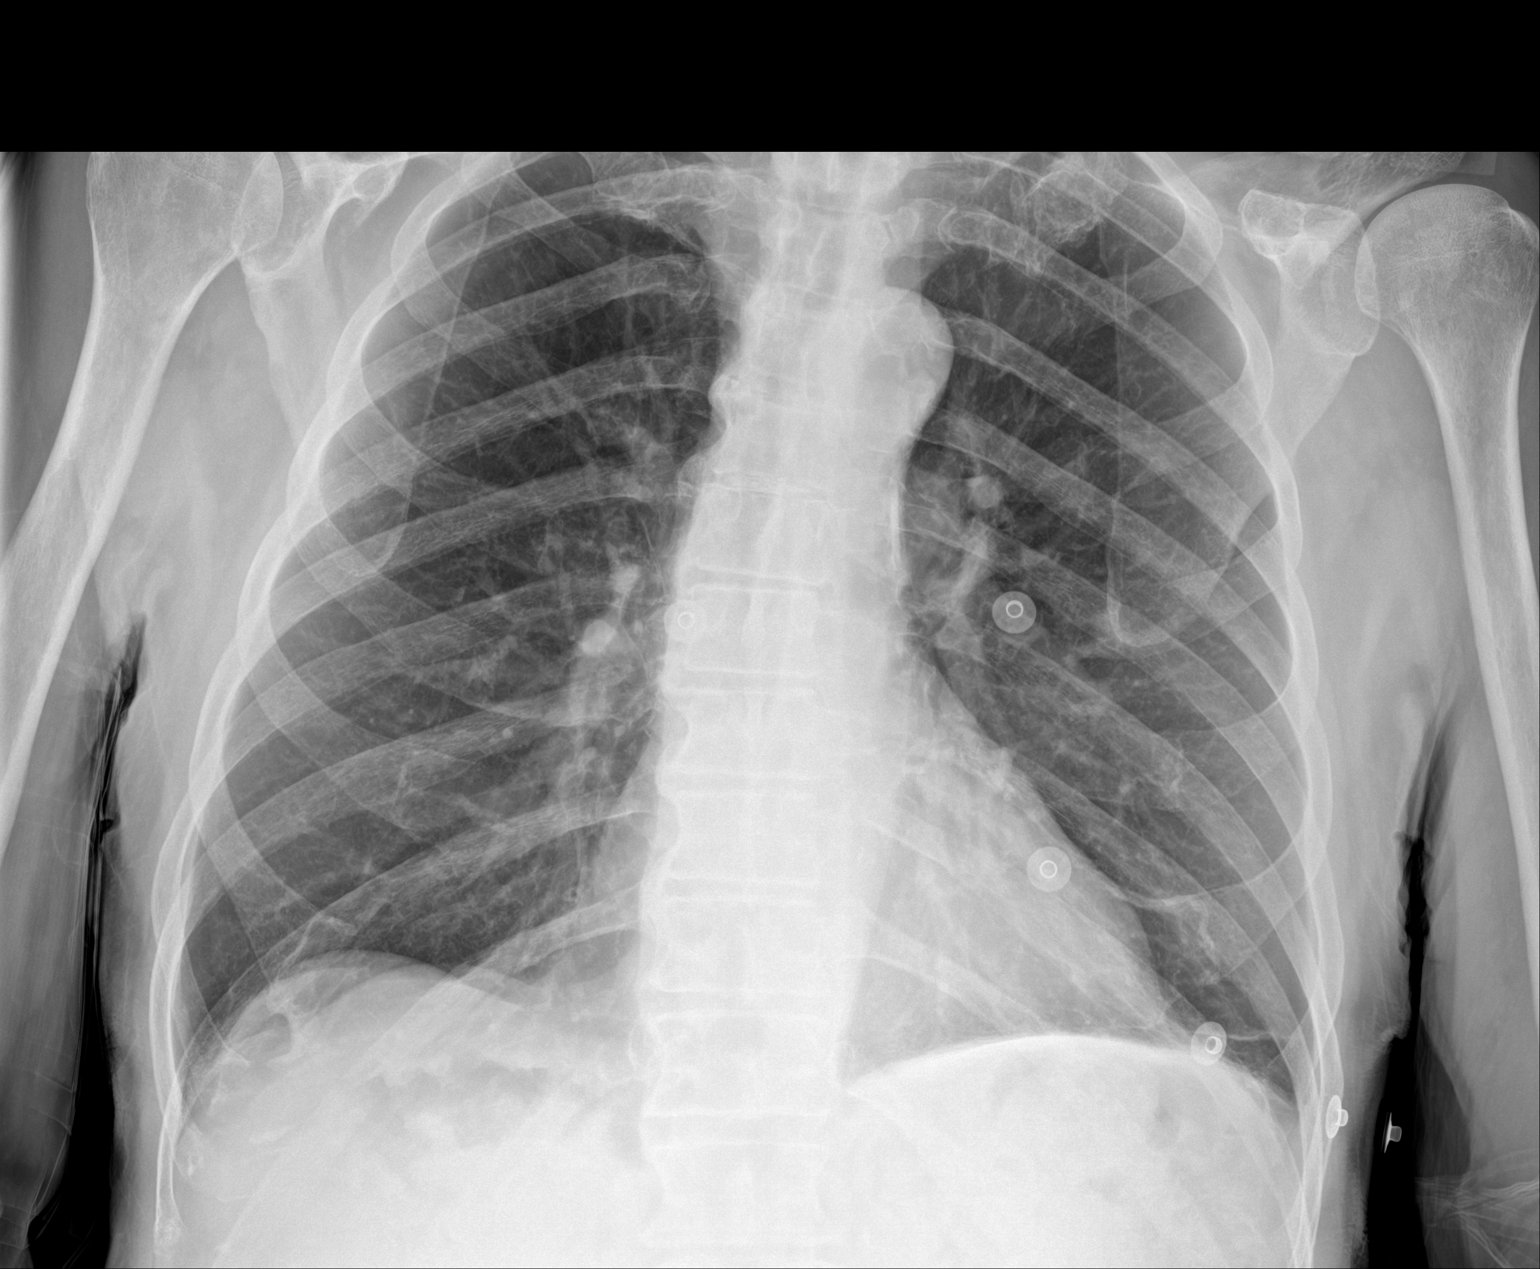

[1 of 1 positions shown; findings below may reference images not displayed]

FINDINGS: The cardiomediastinal silhouette is unchanged in
contour.Atherosclerotic calcifications. No pleural effusion. No
pneumothorax. No acute pleuroparenchymal abnormality. Visualized
abdomen is unremarkable. Multilevel degenerative changes of the
thoracic spine.
IMPRESSION: No acute cardiopulmonary abnormality.

## 2022-09-04 ENCOUNTER — Ambulatory Visit: Payer: Medicare Other | Admitting: Dermatology

## 2022-12-18 ENCOUNTER — Ambulatory Visit: Payer: Medicare Other | Admitting: Dermatology

## 2022-12-18 VITALS — BP 149/79 | HR 69

## 2022-12-18 DIAGNOSIS — Z86006 Personal history of melanoma in-situ: Secondary | ICD-10-CM

## 2022-12-18 DIAGNOSIS — Z1283 Encounter for screening for malignant neoplasm of skin: Secondary | ICD-10-CM | POA: Diagnosis not present

## 2022-12-18 DIAGNOSIS — L814 Other melanin hyperpigmentation: Secondary | ICD-10-CM | POA: Diagnosis not present

## 2022-12-18 DIAGNOSIS — Z85828 Personal history of other malignant neoplasm of skin: Secondary | ICD-10-CM

## 2022-12-18 DIAGNOSIS — L821 Other seborrheic keratosis: Secondary | ICD-10-CM

## 2022-12-18 DIAGNOSIS — W908XXA Exposure to other nonionizing radiation, initial encounter: Secondary | ICD-10-CM

## 2022-12-18 DIAGNOSIS — L578 Other skin changes due to chronic exposure to nonionizing radiation: Secondary | ICD-10-CM

## 2022-12-18 DIAGNOSIS — D1801 Hemangioma of skin and subcutaneous tissue: Secondary | ICD-10-CM

## 2022-12-18 DIAGNOSIS — D229 Melanocytic nevi, unspecified: Secondary | ICD-10-CM

## 2022-12-18 DIAGNOSIS — D485 Neoplasm of uncertain behavior of skin: Secondary | ICD-10-CM

## 2022-12-18 DIAGNOSIS — C44311 Basal cell carcinoma of skin of nose: Secondary | ICD-10-CM

## 2022-12-18 DIAGNOSIS — X32XXXA Exposure to sunlight, initial encounter: Secondary | ICD-10-CM

## 2022-12-18 NOTE — Progress Notes (Signed)
Follow-Up Visit   Subjective  Xavier Foster is a 89 y.o. male who presents for the following: Skin Cancer Screening and Upper Body Skin Exam  The patient presents for Upper Body Skin Exam (UBSE) for skin cancer screening and mole check. The patient has spots, moles and lesions to be evaluated, some may be new or changing. 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  Patient accompanied by daughter, Elita Quick.    The following portions of the chart were reviewed this encounter and updated as appropriate: medications, allergies, medical history  Review of Systems:  No other skin or systemic complaints except as noted in HPI or Assessment and Plan.  Objective  Well appearing patient in no apparent distress; mood and affect are within normal limits.  All skin waist up examined. Relevant physical exam findings are noted in the Assessment and Plan.  Right Nasal Alar 0.7 cm flesh papule, larger compared to previous exam.              Assessment & Plan   Neoplasm of uncertain behavior of skin Right Nasal Alar  Epidermal / dermal shaving  Lesion diameter (cm):  0.9 Informed consent: discussed and consent obtained   Patient was prepped and draped in usual sterile fashion: Area prepped with alcohol. Anesthesia: the lesion was anesthetized in a standard fashion   Anesthetic:  1% lidocaine w/ epinephrine 1-100,000 buffered w/ 8.4% NaHCO3 Instrument used: flexible razor blade   Hemostasis achieved with: pressure, aluminum chloride and electrodesiccation   Outcome: patient tolerated procedure well   Post-procedure details: wound care instructions given   Post-procedure details comment:  Ointment and small bandage applied  Specimen 1 - Surgical pathology Differential Diagnosis: Nevus r/o BCC Check Margins: No  Nevus r/o BCC Will discuss tx options  pending results. Call pt's daughter, Elita Quick, with results [phone]. If BCC, pt not interested in surgery.  Would consider radiation    Lentigines, Seborrheic Keratoses, Hemangiomas - Benign normal skin lesions - Benign-appearing - Call for any changes  Melanocytic Nevi - Tan-brown and/or pink-flesh-colored symmetric macules and papules - Benign appearing on exam today - Observation - Call clinic for new or changing moles - Recommend daily use of broad spectrum spf 30+ sunscreen to sun-exposed areas.   Actinic Damage - Chronic condition, secondary to cumulative UV/sun exposure - diffuse scaly erythematous macules with underlying dyspigmentation - Recommend daily broad spectrum sunscreen SPF 30+ to sun-exposed areas, reapply every 2 hours as needed.  - Staying in the shade or wearing long sleeves, sun glasses (UVA+UVB protection) and wide brim hats (4-inch brim around the entire circumference of the hat) are also recommended for sun protection.  - Call for new or changing lesions.  HEMANGIOMA Exam: left upper neck, 6.69mm violaceous papule Discussed benign nature. Recommend observation. Call for changes.  Skin cancer screening performed today.  HISTORY OF SQUAMOUS CELL CARCINOMA OF THE SKIN Left upper arm below shoulder EDC 05/01/2022 - No evidence of recurrence today - Recommend regular full body skin exams - Recommend daily broad spectrum sunscreen SPF 30+ to sun-exposed areas, reapply every 2 hours as needed.  - Call if any new or changing lesions are noted between office visits  HISTORY OF MELANOMA IN SITU right upper forehead  06/2021 txd with imiquimod cream  New photos taken today. - No evidence of recurrence today, slight pigment  network at inferior edge of scar visible on dermoscopy.  Observe. - Recommend regular full body skin exams - Recommend daily broad spectrum sunscreen SPF 30+ to sun-exposed areas, reapply every 2 hours as needed.  - Call if any new or changing  lesions are noted between office visits  HISTORY OF BASAL CELL CARCINOMA OF THE SKIN Left upper nasal dorsum nodular pattern  10/2021 [facility] surgery - No evidence of recurrence today - Recommend regular full body skin exams - Recommend daily broad spectrum sunscreen SPF 30+ to sun-exposed areas, reapply every 2 hours as needed.  - Call if any new or changing lesions are noted between office visits   Return in about 6 months (around 06/20/2023) for UBSE, Hx melanoma, Hx BCC, Hx SCC.  ICherlyn Labella, CMA, am acting as scribe for Willeen Niece, MD .   Documentation: I have reviewed the above documentation for accuracy and completeness, and I agree with the above.  Willeen Niece, MD

## 2022-12-18 NOTE — Patient Instructions (Addendum)
Wound Care Instructions  Cleanse wound gently with soap and water once a day then pat dry with clean gauze. Apply a thin coat of Petrolatum (petroleum jelly, "Vaseline") over the wound (unless you have an allergy to this). We recommend that you use a new, sterile tube of Vaseline. Do not pick or remove scabs. Do not remove the yellow or white "healing tissue" from the base of the wound.  Cover the wound with fresh, clean, nonstick gauze and secure with paper tape. You may use Band-Aids in place of gauze and tape if the wound is small enough, but would recommend trimming much of the tape off as there is often too much. Sometimes Band-Aids can irritate the skin.  You should call the office for your biopsy report after 1 week if you have not already been contacted.  If you experience any problems, such as abnormal amounts of bleeding, swelling, significant bruising, significant pain, or evidence of infection, please call the office immediately.  FOR ADULT SURGERY PATIENTS: If you need something for pain relief you may take 1 extra strength Tylenol (acetaminophen) AND 2 Ibuprofen (200mg each) together every 4 hours as needed for pain. (do not take these if you are allergic to them or if you have a reason you should not take them.) Typically, you may only need pain medication for 1 to 3 days.     Due to recent changes in healthcare laws, you may see results of your pathology and/or laboratory studies on MyChart before the doctors have had a chance to review them. We understand that in some cases there may be results that are confusing or concerning to you. Please understand that not all results are received at the same time and often the doctors may need to interpret multiple results in order to provide you with the best plan of care or course of treatment. Therefore, we ask that you please give us 2 business days to thoroughly review all your results before contacting the office for clarification. Should  we see a critical lab result, you will be contacted sooner.   If You Need Anything After Your Visit  If you have any questions or concerns for your doctor, please call our main line at [phone] and press option 4 to reach your doctor's medical assistant. If no one answers, please leave a voicemail as directed and we will return your call as soon as possible. Messages left after 4 pm will be answered the following business day.   You may also send us a message via MyChart. We typically respond to MyChart messages within 1-2 business days.  For prescription refills, please ask your pharmacy to contact our office. Our fax number is [phone].  If you have an urgent issue when the clinic is closed that cannot wait until the next business day, you can page your doctor at the number below.    Please note that while we do our best to be available for urgent issues outside of office hours, we are not available 24/7.   If you have an urgent issue and are unable to reach us, you may choose to seek medical care at your doctor's office, retail clinic, urgent care center, or emergency room.  If you have a medical emergency, please immediately call 911 or go to the emergency department.  Pager Numbers  - Dr. Kowalski: [phone]  - Dr. Moye: [phone]  - Dr. Stewart: [phone]  In the event of inclement weather, please call our main line at   [phone] for an update on the status of any delays or closures.  Dermatology Medication Tips: Please keep the boxes that topical medications come in in order to help keep track of the instructions about where and how to use these. Pharmacies typically print the medication instructions only on the boxes and not directly on the medication tubes.   If your medication is too expensive, please contact our office at [phone] option 4 or send us a message through MyChart.   We are unable to tell what your co-pay for medications will be in  advance as this is different depending on your insurance coverage. However, we may be able to find a substitute medication at lower cost or fill out paperwork to get insurance to cover a needed medication.   If a prior authorization is required to get your medication covered by your insurance company, please allow us 1-2 business days to complete this process.  Drug prices often vary depending on where the prescription is filled and some pharmacies may offer cheaper prices.  The website www.goodrx.com contains coupons for medications through different pharmacies. The prices here do not account for what the cost may be with help from insurance (it may be cheaper with your insurance), but the website can give you the price if you did not use any insurance.  - You can print the associated coupon and take it with your prescription to the pharmacy.  - You may also stop by our office during regular business hours and pick up a GoodRx coupon card.  - If you need your prescription sent electronically to a different pharmacy, notify our office through [facility] MyChart or by phone at [phone] option 4.     Si Usted Necesita Algo Despus de Su Visita  Tambin puede enviarnos un mensaje a travs de MyChart. Por lo general respondemos a los mensajes de MyChart en el transcurso de 1 a 2 das hbiles.  Para renovar recetas, por favor pida a su farmacia que se ponga en contacto con nuestra oficina. Nuestro nmero de fax es el [phone].  Si tiene un asunto urgente cuando la clnica est cerrada y que no puede esperar hasta el siguiente da hbil, puede llamar/localizar a su doctor(a) al nmero que aparece a continuacin.   Por favor, tenga en cuenta que aunque hacemos todo lo posible para estar disponibles para asuntos urgentes fuera del horario de oficina, no estamos disponibles las 24 horas del da, los 7 das de la semana.   Si tiene un problema urgente y no puede comunicarse con nosotros, puede  optar por buscar atencin mdica  en el consultorio de su doctor(a), en una clnica privada, en un centro de atencin urgente o en una sala de emergencias.  Si tiene una emergencia mdica, por favor llame inmediatamente al 911 o vaya a la sala de emergencias.  Nmeros de bper  - Dr. Kowalski: [phone]  - Dra. Moye: [phone]  - Dra. Stewart: [phone]  En caso de inclemencias del tiempo, por favor llame a nuestra lnea principal al [phone] para una actualizacin sobre el estado de cualquier retraso o cierre.  Consejos para la medicacin en dermatologa: Por favor, guarde las cajas en las que vienen los medicamentos de uso tpico para ayudarle a seguir las instrucciones sobre dnde y cmo usarlos. Las farmacias generalmente imprimen las instrucciones del medicamento slo en las cajas y no directamente en los tubos del medicamento.   Si su medicamento es muy caro, por favor, pngase en contacto con   nuestra oficina llamando al [phone] y presione la opcin 4 o envenos un mensaje a travs de MyChart.   No podemos decirle cul ser su copago por los medicamentos por adelantado ya que esto es diferente dependiendo de la cobertura de su seguro. Sin embargo, es posible que podamos encontrar un medicamento sustituto a menor costo o llenar un formulario para que el seguro cubra el medicamento que se considera necesario.   Si se requiere una autorizacin previa para que su compaa de seguros cubra su medicamento, por favor permtanos de 1 a 2 das hbiles para completar este proceso.  Los precios de los medicamentos varan con frecuencia dependiendo del lugar de dnde se surte la receta y alguna farmacias pueden ofrecer precios ms baratos.  El sitio web www.goodrx.com tiene cupones para medicamentos de diferentes farmacias. Los precios aqu no tienen en cuenta lo que podra costar con la ayuda del seguro (puede ser ms barato con su seguro), pero el sitio web puede darle el  precio si no utiliz ningn seguro.  - Puede imprimir el cupn correspondiente y llevarlo con su receta a la farmacia.  - Tambin puede pasar por nuestra oficina durante el horario de atencin regular y recoger una tarjeta de cupones de GoodRx.  - Si necesita que su receta se enve electrnicamente a una farmacia diferente, informe a nuestra oficina a travs de MyChart de [facility] o por telfono llamando al [phone] y presione la opcin 4.  

## 2022-12-24 ENCOUNTER — Telehealth: Payer: Self-pay

## 2022-12-24 NOTE — Telephone Encounter (Signed)
-----   Message from Willeen Niece, MD sent at 12/24/2022 12:37 PM EDT ----- Skin , right nasal alar BASAL CELL CARCINOMA, NODULAR PATTERN  BCC skin cancer, Call pt's daughter, Elita Quick, with results [phone].  At last visit, pt said he was not interested in surgery, but would consider radiation.  If so, please schedule consult with Dr. Rushie Chestnut at [facility].

## 2022-12-24 NOTE — Telephone Encounter (Signed)
Patient's daughter, Elita Quick, advised pt's biopsy of the right nasal alar was BCC. Patient not interested in surgery or radiation treatment. Dr Roseanne Reno can do EDC here in office. EDC would leave a round depressed whitish scar about the same size as the original lesion.  It is treated here in office in a procedure we call "scrape and burn".  No further pathology would be performed.  Mid to high 80% cure rate.  Pam will discuss with her father and brother and call back with treatment decision.

## 2022-12-26 ENCOUNTER — Telehealth: Payer: Self-pay

## 2022-12-26 NOTE — Telephone Encounter (Signed)
-----   Message from Tara Stewart, MD sent at 12/24/2022 12:37 PM EDT ----- Skin , right nasal alar BASAL CELL CARCINOMA, NODULAR PATTERN  BCC skin cancer, Call pt's daughter, Pam, with results [phone].  At last visit, pt said he was not interested in surgery, but would consider radiation.  If so, please schedule consult with Dr. Chrystal at [facility]. 

## 2022-12-26 NOTE — Telephone Encounter (Signed)
Spoke to pt's daughter Elita Quick and scheduled pt for [facility] on 04/22/23 at 4:30./sh

## 2023-04-22 ENCOUNTER — Ambulatory Visit: Payer: Medicare Other | Admitting: Dermatology

## 2023-07-02 ENCOUNTER — Encounter: Payer: Self-pay | Admitting: Dermatology

## 2023-07-02 ENCOUNTER — Ambulatory Visit: Payer: Medicare Other | Admitting: Dermatology

## 2023-07-02 DIAGNOSIS — L570 Actinic keratosis: Secondary | ICD-10-CM | POA: Diagnosis not present

## 2023-07-02 DIAGNOSIS — Z86006 Personal history of melanoma in-situ: Secondary | ICD-10-CM

## 2023-07-02 DIAGNOSIS — W908XXA Exposure to other nonionizing radiation, initial encounter: Secondary | ICD-10-CM

## 2023-07-02 DIAGNOSIS — Z1283 Encounter for screening for malignant neoplasm of skin: Secondary | ICD-10-CM

## 2023-07-02 DIAGNOSIS — D1801 Hemangioma of skin and subcutaneous tissue: Secondary | ICD-10-CM

## 2023-07-02 DIAGNOSIS — L578 Other skin changes due to chronic exposure to nonionizing radiation: Secondary | ICD-10-CM

## 2023-07-02 DIAGNOSIS — L739 Follicular disorder, unspecified: Secondary | ICD-10-CM

## 2023-07-02 DIAGNOSIS — D229 Melanocytic nevi, unspecified: Secondary | ICD-10-CM

## 2023-07-02 DIAGNOSIS — Z85828 Personal history of other malignant neoplasm of skin: Secondary | ICD-10-CM

## 2023-07-02 DIAGNOSIS — L820 Inflamed seborrheic keratosis: Secondary | ICD-10-CM | POA: Diagnosis not present

## 2023-07-02 DIAGNOSIS — L821 Other seborrheic keratosis: Secondary | ICD-10-CM

## 2023-07-02 DIAGNOSIS — D692 Other nonthrombocytopenic purpura: Secondary | ICD-10-CM

## 2023-07-02 DIAGNOSIS — L814 Other melanin hyperpigmentation: Secondary | ICD-10-CM

## 2023-07-02 NOTE — Progress Notes (Signed)
Follow-Up Visit   Subjective  Xavier Foster is a 89 y.o. male who presents for the following: Skin Cancer Screening and Upper Body Skin Exam  The patient presents for Upper Body Skin Exam (UBSE) for skin cancer screening and mole check. The patient has spots, moles and lesions to be evaluated, some may be new or changing and the patient may have concern these could be cancer.  HxMM, SCC, BCC.  The following portions of the chart were reviewed this encounter and updated as appropriate: medications, allergies, medical history  Review of Systems:  No other skin or systemic complaints except as noted in HPI or Assessment and Plan.  Objective  Well appearing patient in no apparent distress; mood and affect are within normal limits.  All skin waist up examined. Relevant physical exam findings are noted in the Assessment and Plan.  R hand dorsum x 1, L hand dorsum x 1, L wrist x 1 (3) Erythematous thin papules/macules with gritty scale.   Right Lower Back Perifollicular erythematous papules and pustules   R anterior neck x 1 Erythematous stuck-on, waxy papule    Assessment & Plan   AK (actinic keratosis) (3) R hand dorsum x 1, L hand dorsum x 1, L wrist x 1  Actinic keratoses are precancerous spots that appear secondary to cumulative UV radiation exposure/sun exposure over time. They are chronic with expected duration over 1 year. A portion of actinic keratoses will progress to squamous cell carcinoma of the skin. It is not possible to reliably predict which spots will progress to skin cancer and so treatment is recommended to prevent development of skin cancer.  Recommend daily broad spectrum sunscreen SPF 30+ to sun-exposed areas, reapply every 2 hours as needed.  Recommend staying in the shade or wearing long sleeves, sun glasses (UVA+UVB protection) and wide brim hats (4-inch brim around the entire circumference of the hat). Call for new or changing  lesions.   Destruction of lesion - R hand dorsum x 1, L hand dorsum x 1, L wrist x 1 (3)  Destruction method: cryotherapy   Informed consent: discussed and consent obtained   Lesion destroyed using liquid nitrogen: Yes   Region frozen until ice ball extended beyond lesion: Yes   Outcome: patient tolerated procedure well with no complications   Post-procedure details: wound care instructions given   Additional details:  Prior to procedure, discussed risks of blister formation, small wound, skin dyspigmentation, or rare scar following cryotherapy. Recommend Vaseline ointment to treated areas while healing.   Folliculitis Right Lower Back  Not bothersome for patient.   Benign, observe.    Inflamed seborrheic keratosis R anterior neck x 1  Symptomatic, irritating, patient would like treated.  Benign-appearing.  Call clinic for new or changing lesions.    Destruction of lesion - R anterior neck x 1  Destruction method: cryotherapy   Informed consent: discussed and consent obtained   Lesion destroyed using liquid nitrogen: Yes   Region frozen until ice ball extended beyond lesion: Yes   Outcome: patient tolerated procedure well with no complications   Post-procedure details: wound care instructions given   Additional details:  Prior to procedure, discussed risks of blister formation, small wound, skin dyspigmentation, or rare scar following cryotherapy. Recommend Vaseline ointment to treated areas while healing.    Skin cancer screening performed today.  Actinic Damage - Chronic condition, secondary to cumulative UV/sun exposure - diffuse scaly erythematous macules with underlying dyspigmentation - Recommend daily broad spectrum sunscreen  SPF 30+ to sun-exposed areas, reapply every 2 hours as needed.  - Staying in the shade or wearing long sleeves, sun glasses (UVA+UVB protection) and wide brim hats (4-inch brim around the entire circumference of the hat) are also recommended  for sun protection.  - Call for new or changing lesions.  Lentigines, Seborrheic Keratoses, Hemangiomas - Benign normal skin lesions - Benign-appearing - Call for any changes  Melanocytic Nevi - Tan-brown and/or pink-flesh-colored symmetric macules and papules - Benign appearing on exam today - Observation - Call clinic for new or changing moles - Recommend daily use of broad spectrum spf 30+ sunscreen to sun-exposed areas.   HISTORY OF SQUAMOUS CELL CARCINOMA OF THE SKIN Left upper arm below shoulder EDC 05/01/2022 - No evidence of recurrence today - Recommend regular full body skin exams - Recommend daily broad spectrum sunscreen SPF 30+ to sun-exposed areas, reapply every 2 hours as needed.  - Call if any new or changing lesions are noted between office visits   HISTORY OF MELANOMA IN SITU right upper forehead  06/2021 txd with imiquimod cream  - No evidence of recurrence today, slight pigment network at inferior edge of scar visible on dermoscopy.  Observe. - Recommend regular full body skin exams - Recommend daily broad spectrum sunscreen SPF 30+ to sun-exposed areas, reapply every 2 hours as needed.  - Call if any new or changing lesions are noted between office visits   HISTORY OF BASAL CELL CARCINOMA OF THE SKIN Left upper nasal dorsum nodular pattern  10/2021 [facility] surgery, right nasal ala- clear with biopsy only - No evidence of recurrence today - Recommend regular full body skin exams - Recommend daily broad spectrum sunscreen SPF 30+ to sun-exposed areas, reapply every 2 hours as needed.  - Call if any new or changing lesions are noted between office visits  Purpura - Chronic; persistent and recurrent.  Treatable, but not curable. - Violaceous macules and patches - Benign - Related to trauma, age, sun damage and/or use of blood thinners, chronic use of topical and/or oral steroids - Observe - Can use OTC arnica containing moisturizer such as Dermend Bruise Formula  if desired - Call for worsening or other concerns  Return in about 6 months (around 12/30/2023) for with Dr. Roseanne Reno, [place] follow up, [facility] follow up.  Anise Salvo, RMA, am acting as scribe for Willeen Niece, MD .   Documentation: I have reviewed the above documentation for accuracy and completeness, and I agree with the above.  Willeen Niece, MD

## 2023-07-02 NOTE — Patient Instructions (Addendum)

## 2023-12-31 ENCOUNTER — Ambulatory Visit: Payer: Medicare Other | Admitting: Dermatology

## 2023-12-31 ENCOUNTER — Encounter: Payer: Self-pay | Admitting: Dermatology

## 2023-12-31 DIAGNOSIS — L820 Inflamed seborrheic keratosis: Secondary | ICD-10-CM

## 2023-12-31 DIAGNOSIS — L578 Other skin changes due to chronic exposure to nonionizing radiation: Secondary | ICD-10-CM | POA: Diagnosis not present

## 2023-12-31 DIAGNOSIS — Z85828 Personal history of other malignant neoplasm of skin: Secondary | ICD-10-CM

## 2023-12-31 DIAGNOSIS — Z86006 Personal history of melanoma in-situ: Secondary | ICD-10-CM

## 2023-12-31 DIAGNOSIS — L821 Other seborrheic keratosis: Secondary | ICD-10-CM

## 2023-12-31 DIAGNOSIS — W908XXA Exposure to other nonionizing radiation, initial encounter: Secondary | ICD-10-CM | POA: Diagnosis not present

## 2023-12-31 NOTE — Progress Notes (Signed)
 Follow-Up Visit   Subjective  Xavier Foster is a 89 y.o. male who presents for the following: 6 month AK and ISK follow-up. He has a spot on his left lower leg, noticed about 3 months ago. It was bigger, but part has peeled off. History of BCCs, SCC, and melanoma.   The patient has spots, moles and lesions to be evaluated, some may be new or changing.   This patient is accompanied in the office by his daughter.  The following portions of the chart were reviewed this encounter and updated as appropriate: medications, allergies, medical history  Review of Systems:  No other skin or systemic complaints except as noted in HPI or Assessment and Plan.  Objective  Well appearing patient in no apparent distress; mood and affect are within normal limits.  A focused examination was performed of the following areas: Face, arms, left leg  Relevant physical exam findings are noted in the Assessment and Plan.  L upper calf x 1 Erythematous stuck-on, waxy papule   Assessment & Plan  ACTINIC DAMAGE - chronic, secondary to cumulative UV radiation exposure/sun exposure over time - diffuse scaly erythematous macules with underlying dyspigmentation - Recommend daily broad spectrum sunscreen SPF 30+ to sun-exposed areas, reapply every 2 hours as needed.  - Recommend staying in the shade or wearing long sleeves, sun glasses (UVA+UVB protection) and wide brim hats (4-inch brim around the entire circumference of the hat). - Call for new or changing lesions.  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  - Call if any new or changing lesions are noted between office visits  HISTORY OF BASAL CELL CARCINOMA OF THE SKIN L upper nasal dorsum, EDC 07/2021 and Mohs 12/25/2021 R  nasal alar, clear with bx, 12/18/22 - No evidence of recurrence today - Recommend regular full body skin exams - Recommend daily broad spectrum sunscreen SPF 30+ to sun-exposed areas, reapply every 2 hours as needed.  - Call if any new or changing lesions are noted between office visits   HISTORY OF MELANOMA IN SITU Imiquimod  cream, 2022 - Site with slight pigment at inferior scar, photo taken today. - Observation - Recommend regular full body skin exams - Recommend daily broad spectrum sunscreen SPF 30+ to sun-exposed areas, reapply every 2 hours as needed.  - Call if any new or changing lesions are noted between office visits  INFLAMED SEBORRHEIC KERATOSIS L upper calf x 1 Symptomatic, irritating, patient would like treated.  If doesn't clear with cryotherapy, Pt will RTC and will biopsy. Destruction of lesion - L upper calf x 1  Destruction method: cryotherapy   Informed consent: discussed and consent obtained   Lesion destroyed using liquid nitrogen: Yes   Region frozen until ice ball extended beyond lesion: Yes   Outcome: patient tolerated procedure well with no complications   Post-procedure details: wound care instructions given   Additional details:  Prior to procedure, discussed risks of blister formation, small wound, skin dyspigmentation, or rare scar following cryotherapy. Recommend Vaseline ointment to treated areas while healing.   SEBORRHEIC KERATOSIS - Stuck-on, waxy, tan-brown papules and/or plaques  - Benign-appearing - Discussed benign etiology and prognosis. - Observe - Call for any changes    Return in about 6 months (around 07/02/2024) for UBSE, Hx  melanoma, Hx BCC, Hx SCC.  IBernardine Bridegroom, CMA, am acting as scribe for Artemio Larry, MD .   Documentation: I have reviewed the above documentation for accuracy and completeness, and I agree with the above.  Artemio Larry, MD

## 2023-12-31 NOTE — Patient Instructions (Signed)

## 2024-07-07 ENCOUNTER — Ambulatory Visit: Admitting: Dermatology

## 2024-08-18 ENCOUNTER — Ambulatory Visit: Admitting: Dermatology

## 2024-12-01 ENCOUNTER — Ambulatory Visit: Admitting: Dermatology
